# Patient Record
Sex: Female | Born: 1940 | Race: Black or African American | Hispanic: No | Smoking: Never smoker
Health system: Southern US, Community
[De-identification: ages and names within clinical notes are randomized; demographics above are authoritative.]

## PROBLEM LIST (undated history)

## (undated) DIAGNOSIS — E785 Hyperlipidemia, unspecified: Secondary | ICD-10-CM

## (undated) DIAGNOSIS — I10 Essential (primary) hypertension: Secondary | ICD-10-CM

## (undated) DIAGNOSIS — I739 Peripheral vascular disease, unspecified: Secondary | ICD-10-CM

## (undated) DIAGNOSIS — N189 Chronic kidney disease, unspecified: Secondary | ICD-10-CM

## (undated) DIAGNOSIS — I2510 Atherosclerotic heart disease of native coronary artery without angina pectoris: Secondary | ICD-10-CM

## (undated) DIAGNOSIS — M1990 Unspecified osteoarthritis, unspecified site: Secondary | ICD-10-CM

## (undated) DIAGNOSIS — I4891 Unspecified atrial fibrillation: Secondary | ICD-10-CM

## (undated) DIAGNOSIS — I219 Acute myocardial infarction, unspecified: Secondary | ICD-10-CM

## (undated) DIAGNOSIS — A0472 Enterocolitis due to Clostridium difficile, not specified as recurrent: Secondary | ICD-10-CM

## (undated) DIAGNOSIS — I82409 Acute embolism and thrombosis of unspecified deep veins of unspecified lower extremity: Secondary | ICD-10-CM

## (undated) DIAGNOSIS — I509 Heart failure, unspecified: Secondary | ICD-10-CM

## (undated) HISTORY — DX: Heart failure, unspecified: I50.9

## (undated) HISTORY — DX: Hyperlipidemia, unspecified: E78.5

## (undated) HISTORY — DX: Atherosclerotic heart disease of native coronary artery without angina pectoris: I25.10

## (undated) HISTORY — DX: Unspecified atrial fibrillation: I48.91

## (undated) HISTORY — DX: Peripheral vascular disease, unspecified: I73.9

## (undated) HISTORY — DX: Acute myocardial infarction, unspecified: I21.9

## (undated) HISTORY — DX: Unspecified osteoarthritis, unspecified site: M19.90

## (undated) HISTORY — DX: Acute embolism and thrombosis of unspecified deep veins of unspecified lower extremity: I82.409

## (undated) HISTORY — DX: Essential (primary) hypertension: I10

## (undated) HISTORY — PX: CHOLECYSTECTOMY: SHX55

## (undated) HISTORY — DX: Chronic kidney disease, unspecified: N18.9

## (undated) HISTORY — PX: ABDOMINAL HYSTERECTOMY: SHX81

## (undated) HISTORY — DX: Enterocolitis due to Clostridium difficile, not specified as recurrent: A04.72

---

## 2007-04-04 HISTORY — PX: CORONARY ARTERY BYPASS GRAFT: SHX141

## 2009-06-02 ENCOUNTER — Ambulatory Visit: Payer: Self-pay | Admitting: Vascular Surgery

## 2009-06-04 ENCOUNTER — Ambulatory Visit: Payer: Self-pay | Admitting: Vascular Surgery

## 2009-06-04 ENCOUNTER — Ambulatory Visit (HOSPITAL_COMMUNITY): Admission: RE | Admit: 2009-06-04 | Discharge: 2009-06-04 | Payer: Self-pay | Admitting: Vascular Surgery

## 2009-06-07 ENCOUNTER — Inpatient Hospital Stay (HOSPITAL_COMMUNITY): Admission: RE | Admit: 2009-06-07 | Discharge: 2009-06-20 | Payer: Self-pay | Admitting: Vascular Surgery

## 2009-06-08 ENCOUNTER — Encounter: Payer: Self-pay | Admitting: Vascular Surgery

## 2009-07-15 ENCOUNTER — Ambulatory Visit: Payer: Self-pay | Admitting: Surgery

## 2009-07-15 ENCOUNTER — Encounter (INDEPENDENT_AMBULATORY_CARE_PROVIDER_SITE_OTHER): Payer: Self-pay | Admitting: Internal Medicine

## 2009-07-15 ENCOUNTER — Inpatient Hospital Stay (HOSPITAL_COMMUNITY): Admission: EM | Admit: 2009-07-15 | Discharge: 2009-07-20 | Payer: Self-pay | Admitting: Emergency Medicine

## 2009-08-04 ENCOUNTER — Ambulatory Visit: Payer: Self-pay | Admitting: Vascular Surgery

## 2009-08-12 ENCOUNTER — Inpatient Hospital Stay (HOSPITAL_COMMUNITY): Admission: RE | Admit: 2009-08-12 | Discharge: 2009-08-17 | Payer: Self-pay | Admitting: Vascular Surgery

## 2009-08-13 ENCOUNTER — Encounter: Payer: Self-pay | Admitting: Vascular Surgery

## 2009-08-13 ENCOUNTER — Ambulatory Visit: Payer: Self-pay | Admitting: Vascular Surgery

## 2009-08-13 HISTORY — PX: ABOVE KNEE LEG AMPUTATION: SUR20

## 2009-08-16 ENCOUNTER — Ambulatory Visit: Payer: Self-pay | Admitting: Physical Medicine & Rehabilitation

## 2009-08-17 ENCOUNTER — Inpatient Hospital Stay (HOSPITAL_COMMUNITY)
Admission: RE | Admit: 2009-08-17 | Discharge: 2009-08-27 | Payer: Self-pay | Admitting: Physical Medicine & Rehabilitation

## 2009-09-08 ENCOUNTER — Ambulatory Visit: Payer: Self-pay | Admitting: Vascular Surgery

## 2009-11-10 ENCOUNTER — Ambulatory Visit: Payer: Self-pay | Admitting: Vascular Surgery

## 2009-11-26 ENCOUNTER — Encounter
Admission: RE | Admit: 2009-11-26 | Discharge: 2010-02-24 | Payer: Self-pay | Source: Home / Self Care | Admitting: Physical Medicine & Rehabilitation

## 2009-12-03 ENCOUNTER — Ambulatory Visit: Payer: Self-pay | Admitting: Physical Medicine & Rehabilitation

## 2010-02-01 ENCOUNTER — Encounter: Admission: RE | Admit: 2010-02-01 | Discharge: 2010-03-01 | Payer: Self-pay | Source: Home / Self Care

## 2010-02-17 ENCOUNTER — Ambulatory Visit: Payer: Self-pay | Admitting: Vascular Surgery

## 2010-02-25 ENCOUNTER — Ambulatory Visit (HOSPITAL_COMMUNITY): Admission: RE | Admit: 2010-02-25 | Discharge: 2010-02-25 | Payer: Self-pay | Admitting: Vascular Surgery

## 2010-02-25 ENCOUNTER — Ambulatory Visit: Payer: Self-pay | Admitting: Vascular Surgery

## 2010-03-08 ENCOUNTER — Inpatient Hospital Stay (HOSPITAL_COMMUNITY)
Admission: RE | Admit: 2010-03-08 | Discharge: 2010-03-14 | Payer: Self-pay | Source: Home / Self Care | Attending: Vascular Surgery | Admitting: Vascular Surgery

## 2010-03-08 HISTORY — PX: FEMORAL BYPASS: SHX50

## 2010-03-10 ENCOUNTER — Encounter: Payer: Self-pay | Admitting: Vascular Surgery

## 2010-03-24 ENCOUNTER — Ambulatory Visit: Payer: Self-pay | Admitting: Vascular Surgery

## 2010-04-07 ENCOUNTER — Ambulatory Visit
Admission: RE | Admit: 2010-04-07 | Discharge: 2010-04-07 | Payer: Self-pay | Source: Home / Self Care | Attending: Vascular Surgery | Admitting: Vascular Surgery

## 2010-04-28 ENCOUNTER — Ambulatory Visit: Admit: 2010-04-28 | Payer: Self-pay | Admitting: Vascular Surgery

## 2010-05-12 ENCOUNTER — Ambulatory Visit (INDEPENDENT_AMBULATORY_CARE_PROVIDER_SITE_OTHER): Payer: Medicare Other | Admitting: Vascular Surgery

## 2010-05-12 DIAGNOSIS — I70219 Atherosclerosis of native arteries of extremities with intermittent claudication, unspecified extremity: Secondary | ICD-10-CM

## 2010-05-13 NOTE — Assessment & Plan Note (Addendum)
OFFICE VISIT  SANDREA, BOER DOB:  1940/09/30                                       05/12/2010 ZOXWR#:[id number]  The patient returns for followup today.  She previously underwent a left femoral to below knee popliteal bypass with Propaten on December 6.  She had some trouble healing up her groin incision and presents today for continued observation of this.  She denies any further drainage from the incision.  She states that she has no pain in her left foot.  She has some intermittent phantom pains where her right foot used to be.  She is only minimally using her right above knee amputation prosthetic.  I discussed with her today continuing to use this over time to try to improve her ambulation.  PHYSICAL EXAM:  Today blood pressure is 146/71 in the left arm, heart rate 69 and regular and temperature is 98.1.  Left groin wound is completely healed at this point.  She has an easily palpable 2+ dorsalis pedis pulse in the left foot.  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  Overall, the patient is doing well at this point.  I encouraged her again to try to use her prosthetic leg for the right leg.  She will follow up with a protocol scan in March of 2012.    [name]. Bretta Fees, MD Electronically Signed  CEF/MEDQ  D:  05/12/2010  T:  05/13/2010  Job:  [id number]  cc:   Charlynne Pander

## 2010-06-14 LAB — TYPE AND SCREEN
Antibody Screen: NEGATIVE
Unit division: 0
Unit division: 0
Unit division: 0
Unit division: 0

## 2010-06-14 LAB — CBC
HCT: 33.2 % — ABNORMAL LOW (ref 36.0–46.0)
HCT: 33.8 % — ABNORMAL LOW (ref 36.0–46.0)
HCT: 34.6 % — ABNORMAL LOW (ref 36.0–46.0)
Hemoglobin: 11.4 g/dL — ABNORMAL LOW (ref 12.0–15.0)
Hemoglobin: 9.3 g/dL — ABNORMAL LOW (ref 12.0–15.0)
MCH: 28.1 pg (ref 26.0–34.0)
MCH: 28.3 pg (ref 26.0–34.0)
MCH: 28.4 pg (ref 26.0–34.0)
MCH: 28.7 pg (ref 26.0–34.0)
MCHC: 31.7 g/dL (ref 30.0–36.0)
MCHC: 32.7 g/dL (ref 30.0–36.0)
MCHC: 32.8 g/dL (ref 30.0–36.0)
MCHC: 33.1 g/dL (ref 30.0–36.0)
MCV: 85.2 fL (ref 78.0–100.0)
MCV: 85.8 fL (ref 78.0–100.0)
MCV: 88.5 fL (ref 78.0–100.0)
Platelets: 149 10*3/uL — ABNORMAL LOW (ref 150–400)
Platelets: 172 10*3/uL (ref 150–400)
Platelets: 200 10*3/uL (ref 150–400)
Platelets: 214 10*3/uL (ref 150–400)
Platelets: 321 10*3/uL (ref 150–400)
RBC: 3.07 MIL/uL — ABNORMAL LOW (ref 3.87–5.11)
RBC: 3.82 MIL/uL — ABNORMAL LOW (ref 3.87–5.11)
RBC: 3.87 MIL/uL (ref 3.87–5.11)
RBC: 4.06 MIL/uL (ref 3.87–5.11)
RDW: 13.5 % (ref 11.5–15.5)
RDW: 13.7 % (ref 11.5–15.5)
RDW: 14.3 % (ref 11.5–15.5)
WBC: 11.4 10*3/uL — ABNORMAL HIGH (ref 4.0–10.5)
WBC: 9.4 10*3/uL (ref 4.0–10.5)

## 2010-06-14 LAB — BASIC METABOLIC PANEL
BUN: 22 mg/dL (ref 6–23)
BUN: 22 mg/dL (ref 6–23)
BUN: 27 mg/dL — ABNORMAL HIGH (ref 6–23)
BUN: 34 mg/dL — ABNORMAL HIGH (ref 6–23)
CO2: 21 mEq/L (ref 19–32)
CO2: 22 mEq/L (ref 19–32)
CO2: 23 mEq/L (ref 19–32)
Calcium: 7.8 mg/dL — ABNORMAL LOW (ref 8.4–10.5)
Calcium: 8.2 mg/dL — ABNORMAL LOW (ref 8.4–10.5)
Calcium: 8.2 mg/dL — ABNORMAL LOW (ref 8.4–10.5)
Chloride: 110 mEq/L (ref 96–112)
Chloride: 111 mEq/L (ref 96–112)
Chloride: 115 mEq/L — ABNORMAL HIGH (ref 96–112)
Chloride: 117 mEq/L — ABNORMAL HIGH (ref 96–112)
Creatinine, Ser: 1.86 mg/dL — ABNORMAL HIGH (ref 0.4–1.2)
Creatinine, Ser: 1.91 mg/dL — ABNORMAL HIGH (ref 0.4–1.2)
Creatinine, Ser: 1.94 mg/dL — ABNORMAL HIGH (ref 0.4–1.2)
Creatinine, Ser: 1.95 mg/dL — ABNORMAL HIGH (ref 0.4–1.2)
Creatinine, Ser: 2.38 mg/dL — ABNORMAL HIGH (ref 0.4–1.2)
Creatinine, Ser: 2.5 mg/dL — ABNORMAL HIGH (ref 0.4–1.2)
GFR calc Af Amer: 23 mL/min — ABNORMAL LOW (ref >60)
GFR calc Af Amer: 32 mL/min — ABNORMAL LOW (ref >60)
GFR calc Af Amer: 33 mL/min — ABNORMAL LOW (ref >60)
GFR calc non Af Amer: 26 mL/min — ABNORMAL LOW (ref >60)
Glucose, Bld: 103 mg/dL — ABNORMAL HIGH (ref 70–99)
Glucose, Bld: 129 mg/dL — ABNORMAL HIGH (ref 70–99)
Potassium: 3.3 mEq/L — ABNORMAL LOW (ref 3.5–5.1)
Potassium: 3.5 mEq/L (ref 3.5–5.1)
Potassium: 5.6 mEq/L — ABNORMAL HIGH (ref 3.5–5.1)

## 2010-06-14 LAB — POCT I-STAT, CHEM 8
Calcium, Ion: 1.37 mmol/L — ABNORMAL HIGH (ref 1.12–1.32)
Chloride: 111 mEq/L (ref 96–112)
Glucose, Bld: 222 mg/dL — ABNORMAL HIGH (ref 70–99)
HCT: 36 % (ref 36.0–46.0)
TCO2: 25 mmol/L (ref 0–100)

## 2010-06-14 LAB — URINALYSIS, ROUTINE W REFLEX MICROSCOPIC
Bilirubin Urine: NEGATIVE
Ketones, ur: NEGATIVE mg/dL
Leukocytes, UA: NEGATIVE
Nitrite: NEGATIVE
Protein, ur: 100 mg/dL — AB
pH: 6 (ref 5.0–8.0)

## 2010-06-14 LAB — COMPREHENSIVE METABOLIC PANEL
ALT: 23 U/L (ref 0–35)
Albumin: 3.4 g/dL — ABNORMAL LOW (ref 3.5–5.2)
Alkaline Phosphatase: 121 U/L — ABNORMAL HIGH (ref 39–117)
Chloride: 108 mEq/L (ref 96–112)
Potassium: 5 mEq/L (ref 3.5–5.1)
Sodium: 142 mEq/L (ref 135–145)
Total Bilirubin: 0.3 mg/dL (ref 0.3–1.2)
Total Protein: 6.6 g/dL (ref 6.0–8.3)

## 2010-06-14 LAB — GLUCOSE, CAPILLARY
Glucose-Capillary: 119 mg/dL — ABNORMAL HIGH (ref 70–99)
Glucose-Capillary: 144 mg/dL — ABNORMAL HIGH (ref 70–99)
Glucose-Capillary: 145 mg/dL — ABNORMAL HIGH (ref 70–99)
Glucose-Capillary: 147 mg/dL — ABNORMAL HIGH (ref 70–99)
Glucose-Capillary: 152 mg/dL — ABNORMAL HIGH (ref 70–99)
Glucose-Capillary: 158 mg/dL — ABNORMAL HIGH (ref 70–99)
Glucose-Capillary: 161 mg/dL — ABNORMAL HIGH (ref 70–99)
Glucose-Capillary: 172 mg/dL — ABNORMAL HIGH (ref 70–99)
Glucose-Capillary: 194 mg/dL — ABNORMAL HIGH (ref 70–99)
Glucose-Capillary: 66 mg/dL — ABNORMAL LOW (ref 70–99)
Glucose-Capillary: 67 mg/dL — ABNORMAL LOW (ref 70–99)
Glucose-Capillary: 81 mg/dL (ref 70–99)
Glucose-Capillary: 82 mg/dL (ref 70–99)
Glucose-Capillary: 190 mg/dL — ABNORMAL HIGH (ref 70–99)

## 2010-06-14 LAB — PROTIME-INR
INR: 1.16 (ref 0.00–1.49)
INR: 1.16 (ref 0.00–1.49)
Prothrombin Time: 15 seconds (ref 11.6–15.2)
Prothrombin Time: 15 seconds (ref 11.6–15.2)

## 2010-06-14 LAB — POCT I-STAT 7, (LYTES, BLD GAS, ICA,H+H)
Acid-base deficit: 2 mmol/L (ref 0.0–2.0)
Bicarbonate: 22.4 mEq/L (ref 20.0–24.0)
HCT: 26 % — ABNORMAL LOW (ref 36.0–46.0)
Hemoglobin: 8.8 g/dL — ABNORMAL LOW (ref 12.0–15.0)
TCO2: 23 mmol/L (ref 0–100)
pO2, Arterial: 299 mmHg — ABNORMAL HIGH (ref 80.0–100.0)

## 2010-06-14 LAB — SURGICAL PCR SCREEN: MRSA, PCR: NEGATIVE

## 2010-06-14 LAB — PREPARE RBC (CROSSMATCH)

## 2010-06-14 LAB — POCT I-STAT GLUCOSE: Glucose, Bld: 118 mg/dL — ABNORMAL HIGH (ref 70–99)

## 2010-06-14 LAB — URINE MICROSCOPIC-ADD ON

## 2010-06-20 LAB — CBC
HCT: 22.5 % — ABNORMAL LOW (ref 36.0–46.0)
Hemoglobin: 7.4 g/dL — ABNORMAL LOW (ref 12.0–15.0)
Hemoglobin: 7.5 g/dL — ABNORMAL LOW (ref 12.0–15.0)
MCHC: 32.8 g/dL (ref 30.0–36.0)
MCHC: 33 g/dL (ref 30.0–36.0)
MCHC: 33.6 g/dL (ref 30.0–36.0)
MCV: 89.9 fL (ref 78.0–100.0)
Platelets: 307 10*3/uL (ref 150–400)
RBC: 2.5 MIL/uL — ABNORMAL LOW (ref 3.87–5.11)
RDW: 15.3 % (ref 11.5–15.5)
RDW: 15.5 % (ref 11.5–15.5)

## 2010-06-20 LAB — GLUCOSE, CAPILLARY
Glucose-Capillary: 103 mg/dL — ABNORMAL HIGH (ref 70–99)
Glucose-Capillary: 108 mg/dL — ABNORMAL HIGH (ref 70–99)
Glucose-Capillary: 124 mg/dL — ABNORMAL HIGH (ref 70–99)
Glucose-Capillary: 130 mg/dL — ABNORMAL HIGH (ref 70–99)
Glucose-Capillary: 131 mg/dL — ABNORMAL HIGH (ref 70–99)
Glucose-Capillary: 136 mg/dL — ABNORMAL HIGH (ref 70–99)
Glucose-Capillary: 138 mg/dL — ABNORMAL HIGH (ref 70–99)
Glucose-Capillary: 138 mg/dL — ABNORMAL HIGH (ref 70–99)
Glucose-Capillary: 142 mg/dL — ABNORMAL HIGH (ref 70–99)
Glucose-Capillary: 150 mg/dL — ABNORMAL HIGH (ref 70–99)
Glucose-Capillary: 150 mg/dL — ABNORMAL HIGH (ref 70–99)
Glucose-Capillary: 154 mg/dL — ABNORMAL HIGH (ref 70–99)
Glucose-Capillary: 162 mg/dL — ABNORMAL HIGH (ref 70–99)
Glucose-Capillary: 164 mg/dL — ABNORMAL HIGH (ref 70–99)
Glucose-Capillary: 166 mg/dL — ABNORMAL HIGH (ref 70–99)
Glucose-Capillary: 170 mg/dL — ABNORMAL HIGH (ref 70–99)
Glucose-Capillary: 170 mg/dL — ABNORMAL HIGH (ref 70–99)
Glucose-Capillary: 173 mg/dL — ABNORMAL HIGH (ref 70–99)
Glucose-Capillary: 176 mg/dL — ABNORMAL HIGH (ref 70–99)
Glucose-Capillary: 182 mg/dL — ABNORMAL HIGH (ref 70–99)
Glucose-Capillary: 241 mg/dL — ABNORMAL HIGH (ref 70–99)
Glucose-Capillary: 242 mg/dL — ABNORMAL HIGH (ref 70–99)
Glucose-Capillary: 65 mg/dL — ABNORMAL LOW (ref 70–99)
Glucose-Capillary: 73 mg/dL (ref 70–99)
Glucose-Capillary: 99 mg/dL (ref 70–99)

## 2010-06-20 LAB — DIFFERENTIAL
Lymphs Abs: 2.6 10*3/uL (ref 0.7–4.0)
Monocytes Relative: 4 % (ref 3–12)
Neutro Abs: 11.7 10*3/uL — ABNORMAL HIGH (ref 1.7–7.7)
Neutrophils Relative %: 76 % (ref 43–77)

## 2010-06-20 LAB — COMPREHENSIVE METABOLIC PANEL
ALT: 20 U/L (ref 0–35)
Albumin: 2.8 g/dL — ABNORMAL LOW (ref 3.5–5.2)
BUN: 14 mg/dL (ref 6–23)
Calcium: 10 mg/dL (ref 8.4–10.5)
Glucose, Bld: 215 mg/dL — ABNORMAL HIGH (ref 70–99)
Sodium: 135 mEq/L (ref 135–145)
Total Protein: 6.3 g/dL (ref 6.0–8.3)

## 2010-06-20 LAB — URINALYSIS, ROUTINE W REFLEX MICROSCOPIC
Bilirubin Urine: NEGATIVE
Ketones, ur: NEGATIVE mg/dL
Nitrite: NEGATIVE
Protein, ur: 100 mg/dL — AB
Urobilinogen, UA: 0.2 mg/dL (ref 0.0–1.0)
pH: 5.5 (ref 5.0–8.0)

## 2010-06-20 LAB — RENAL FUNCTION PANEL
Albumin: 2.8 g/dL — ABNORMAL LOW (ref 3.5–5.2)
BUN: 9 mg/dL (ref 6–23)
Chloride: 106 mEq/L (ref 96–112)
Glucose, Bld: 145 mg/dL — ABNORMAL HIGH (ref 70–99)
Phosphorus: 3.1 mg/dL (ref 2.3–4.6)
Potassium: 5 mEq/L (ref 3.5–5.1)
Sodium: 135 mEq/L (ref 135–145)

## 2010-06-20 LAB — HEMOCCULT GUIAC POC 1CARD (OFFICE): Fecal Occult Bld: NEGATIVE

## 2010-06-20 LAB — BASIC METABOLIC PANEL
BUN: 14 mg/dL (ref 6–23)
CO2: 24 mEq/L (ref 19–32)
Calcium: 10.1 mg/dL (ref 8.4–10.5)
Creatinine, Ser: 1.62 mg/dL — ABNORMAL HIGH (ref 0.4–1.2)
Glucose, Bld: 193 mg/dL — ABNORMAL HIGH (ref 70–99)

## 2010-06-20 LAB — FERRITIN: Ferritin: 713 ng/mL — ABNORMAL HIGH (ref 10–291)

## 2010-06-20 LAB — URINE MICROSCOPIC-ADD ON

## 2010-06-20 LAB — URINE CULTURE

## 2010-06-20 LAB — CARDIAC PANEL(CRET KIN+CKTOT+MB+TROPI)
CK, MB: 2.7 ng/mL (ref 0.3–4.0)
Relative Index: 1.7 (ref 0.0–2.5)

## 2010-06-20 LAB — HEMOGLOBIN AND HEMATOCRIT, BLOOD
HCT: 29.9 % — ABNORMAL LOW (ref 36.0–46.0)
Hemoglobin: 9.9 g/dL — ABNORMAL LOW (ref 12.0–15.0)

## 2010-06-21 LAB — GLUCOSE, CAPILLARY
Glucose-Capillary: 114 mg/dL — ABNORMAL HIGH (ref 70–99)
Glucose-Capillary: 118 mg/dL — ABNORMAL HIGH (ref 70–99)
Glucose-Capillary: 148 mg/dL — ABNORMAL HIGH (ref 70–99)
Glucose-Capillary: 149 mg/dL — ABNORMAL HIGH (ref 70–99)
Glucose-Capillary: 164 mg/dL — ABNORMAL HIGH (ref 70–99)
Glucose-Capillary: 168 mg/dL — ABNORMAL HIGH (ref 70–99)
Glucose-Capillary: 188 mg/dL — ABNORMAL HIGH (ref 70–99)
Glucose-Capillary: 199 mg/dL — ABNORMAL HIGH (ref 70–99)
Glucose-Capillary: 89 mg/dL (ref 70–99)
Glucose-Capillary: 123 mg/dL — ABNORMAL HIGH (ref 70–99)
Glucose-Capillary: 150 mg/dL — ABNORMAL HIGH (ref 70–99)
Glucose-Capillary: 150 mg/dL — ABNORMAL HIGH (ref 70–99)
Glucose-Capillary: 153 mg/dL — ABNORMAL HIGH (ref 70–99)
Glucose-Capillary: 166 mg/dL — ABNORMAL HIGH (ref 70–99)
Glucose-Capillary: 195 mg/dL — ABNORMAL HIGH (ref 70–99)
Glucose-Capillary: 208 mg/dL — ABNORMAL HIGH (ref 70–99)
Glucose-Capillary: 98 mg/dL (ref 70–99)

## 2010-06-21 LAB — BASIC METABOLIC PANEL
BUN: 18 mg/dL (ref 6–23)
BUN: 21 mg/dL (ref 6–23)
BUN: 22 mg/dL (ref 6–23)
BUN: 6 mg/dL (ref 6–23)
BUN: 6 mg/dL (ref 6–23)
CO2: 24 mEq/L (ref 19–32)
CO2: 24 mEq/L (ref 19–32)
CO2: 27 mEq/L (ref 19–32)
CO2: 27 mEq/L (ref 19–32)
Calcium: 10.5 mg/dL (ref 8.4–10.5)
Calcium: 11.3 mg/dL — ABNORMAL HIGH (ref 8.4–10.5)
Chloride: 105 mEq/L (ref 96–112)
Chloride: 105 mEq/L (ref 96–112)
Chloride: 109 mEq/L (ref 96–112)
Chloride: 106 mEq/L (ref 96–112)
Chloride: 107 mEq/L (ref 96–112)
Creatinine, Ser: 2.06 mg/dL — ABNORMAL HIGH (ref 0.4–1.2)
Creatinine, Ser: 2.15 mg/dL — ABNORMAL HIGH (ref 0.4–1.2)
Creatinine, Ser: 2.19 mg/dL — ABNORMAL HIGH (ref 0.4–1.2)
Creatinine, Ser: 1.33 mg/dL — ABNORMAL HIGH (ref 0.4–1.2)
GFR calc Af Amer: 27 mL/min — ABNORMAL LOW (ref >60)
GFR calc Af Amer: 28 mL/min — ABNORMAL LOW (ref >60)
GFR calc Af Amer: 48 mL/min — ABNORMAL LOW (ref >60)
GFR calc non Af Amer: 23 mL/min — ABNORMAL LOW (ref >60)
GFR calc non Af Amer: 38 mL/min — ABNORMAL LOW (ref >60)
Glucose, Bld: 175 mg/dL — ABNORMAL HIGH (ref 70–99)
Glucose, Bld: 219 mg/dL — ABNORMAL HIGH (ref 70–99)
Potassium: 5.5 mEq/L — ABNORMAL HIGH (ref 3.5–5.1)
Potassium: 4.1 mEq/L (ref 3.5–5.1)
Potassium: 4.4 mEq/L (ref 3.5–5.1)
Sodium: 140 mEq/L (ref 135–145)

## 2010-06-21 LAB — CARDIAC PANEL(CRET KIN+CKTOT+MB+TROPI)
Relative Index: 1.6 (ref 0.0–2.5)
Relative Index: 2 (ref 0.0–2.5)
Troponin I: 0.06 ng/mL (ref 0.00–0.06)

## 2010-06-21 LAB — RENAL FUNCTION PANEL
BUN: 14 mg/dL (ref 6–23)
CO2: 20 mEq/L (ref 19–32)
Calcium: 9.6 mg/dL (ref 8.4–10.5)
Creatinine, Ser: 1.45 mg/dL — ABNORMAL HIGH (ref 0.4–1.2)
Glucose, Bld: 119 mg/dL — ABNORMAL HIGH (ref 70–99)
Phosphorus: 2.6 mg/dL (ref 2.3–4.6)

## 2010-06-21 LAB — CBC
HCT: 33.4 % — ABNORMAL LOW (ref 36.0–46.0)
HCT: 29.4 % — ABNORMAL LOW (ref 36.0–46.0)
HCT: 31.6 % — ABNORMAL LOW (ref 36.0–46.0)
Hemoglobin: 8.9 g/dL — ABNORMAL LOW (ref 12.0–15.0)
Hemoglobin: 10.1 g/dL — ABNORMAL LOW (ref 12.0–15.0)
MCHC: 32.8 g/dL (ref 30.0–36.0)
MCHC: 33.3 g/dL (ref 30.0–36.0)
MCHC: 33.4 g/dL (ref 30.0–36.0)
MCHC: 33.5 g/dL (ref 30.0–36.0)
MCHC: 33.6 g/dL (ref 30.0–36.0)
MCV: 88.7 fL (ref 78.0–100.0)
MCV: 88.9 fL (ref 78.0–100.0)
MCV: 89 fL (ref 78.0–100.0)
MCV: 89.2 fL (ref 78.0–100.0)
MCV: 89.3 fL (ref 78.0–100.0)
MCV: 89.4 fL (ref 78.0–100.0)
Platelets: 286 10*3/uL (ref 150–400)
Platelets: 311 10*3/uL (ref 150–400)
Platelets: 323 10*3/uL (ref 150–400)
Platelets: 340 10*3/uL (ref 150–400)
Platelets: 341 10*3/uL (ref 150–400)
RBC: 3.04 MIL/uL — ABNORMAL LOW (ref 3.87–5.11)
RBC: 3.93 MIL/uL (ref 3.87–5.11)
RBC: 3.95 MIL/uL (ref 3.87–5.11)
RBC: 3.38 MIL/uL — ABNORMAL LOW (ref 3.87–5.11)
RDW: 14.6 % (ref 11.5–15.5)
RDW: 15.6 % — ABNORMAL HIGH (ref 11.5–15.5)
RDW: 15.8 % — ABNORMAL HIGH (ref 11.5–15.5)
RDW: 16.2 % — ABNORMAL HIGH (ref 11.5–15.5)
WBC: 10.4 10*3/uL (ref 4.0–10.5)
WBC: 11.1 10*3/uL — ABNORMAL HIGH (ref 4.0–10.5)

## 2010-06-21 LAB — HEMOGLOBIN A1C
Hgb A1c MFr Bld: 7.9 % — ABNORMAL HIGH (ref <5.7)
Mean Plasma Glucose: 180 mg/dL — ABNORMAL HIGH (ref <117)

## 2010-06-22 LAB — GLUCOSE, CAPILLARY
Glucose-Capillary: 170 mg/dL — ABNORMAL HIGH (ref 70–99)
Glucose-Capillary: 175 mg/dL — ABNORMAL HIGH (ref 70–99)
Glucose-Capillary: 176 mg/dL — ABNORMAL HIGH (ref 70–99)
Glucose-Capillary: 212 mg/dL — ABNORMAL HIGH (ref 70–99)
Glucose-Capillary: 250 mg/dL — ABNORMAL HIGH (ref 70–99)

## 2010-06-22 LAB — POCT I-STAT, CHEM 8
BUN: 6 mg/dL (ref 6–23)
Chloride: 105 mEq/L (ref 96–112)
Creatinine, Ser: 1.4 mg/dL — ABNORMAL HIGH (ref 0.4–1.2)
Potassium: 3.2 mEq/L — ABNORMAL LOW (ref 3.5–5.1)
Sodium: 140 mEq/L (ref 135–145)
TCO2: 24 mmol/L (ref 0–100)

## 2010-06-22 LAB — DIFFERENTIAL
Basophils Absolute: 0 10*3/uL (ref 0.0–0.1)
Basophils Relative: 0 % (ref 0–1)
Eosinophils Absolute: 0.1 10*3/uL (ref 0.0–0.7)
Lymphocytes Relative: 16 % (ref 12–46)
Lymphs Abs: 2.1 10*3/uL (ref 0.7–4.0)
Lymphs Abs: 2.3 10*3/uL (ref 0.7–4.0)
Monocytes Absolute: 0.8 10*3/uL (ref 0.1–1.0)
Monocytes Relative: 6 % (ref 3–12)
Neutro Abs: 10.9 10*3/uL — ABNORMAL HIGH (ref 1.7–7.7)

## 2010-06-22 LAB — IRON AND TIBC: UIBC: 127 ug/dL

## 2010-06-22 LAB — CBC
HCT: 31.9 % — ABNORMAL LOW (ref 36.0–46.0)
HCT: 32 % — ABNORMAL LOW (ref 36.0–46.0)
Hemoglobin: 10.4 g/dL — ABNORMAL LOW (ref 12.0–15.0)
MCHC: 32.6 g/dL (ref 30.0–36.0)
MCHC: 33.4 g/dL (ref 30.0–36.0)
MCV: 88.8 fL (ref 78.0–100.0)
MCV: 88.9 fL (ref 78.0–100.0)
Platelets: 309 10*3/uL (ref 150–400)
RBC: 3.6 MIL/uL — ABNORMAL LOW (ref 3.87–5.11)
RBC: 3.6 MIL/uL — ABNORMAL LOW (ref 3.87–5.11)
WBC: 13.7 10*3/uL — ABNORMAL HIGH (ref 4.0–10.5)
WBC: 14.3 10*3/uL — ABNORMAL HIGH (ref 4.0–10.5)

## 2010-06-22 LAB — MAGNESIUM: Magnesium: 0.9 mg/dL — CL (ref 1.5–2.5)

## 2010-06-22 LAB — URINALYSIS, ROUTINE W REFLEX MICROSCOPIC
Bilirubin Urine: NEGATIVE
Hgb urine dipstick: NEGATIVE
Specific Gravity, Urine: 1.007 (ref 1.005–1.030)
pH: 5 (ref 5.0–8.0)

## 2010-06-22 LAB — HEMOGLOBIN A1C
Hgb A1c MFr Bld: 7.7 % — ABNORMAL HIGH (ref <5.7)
Mean Plasma Glucose: 174 mg/dL — ABNORMAL HIGH (ref <117)

## 2010-06-22 LAB — WOUND CULTURE

## 2010-06-22 LAB — URINE MICROSCOPIC-ADD ON

## 2010-06-22 LAB — FERRITIN: Ferritin: 420 ng/mL — ABNORMAL HIGH (ref 10–291)

## 2010-06-22 LAB — COMPREHENSIVE METABOLIC PANEL
AST: 13 U/L (ref 0–37)
BUN: 5 mg/dL — ABNORMAL LOW (ref 6–23)
CO2: 25 mEq/L (ref 19–32)
Calcium: 8.6 mg/dL (ref 8.4–10.5)
Chloride: 101 mEq/L (ref 96–112)
Creatinine, Ser: 1.35 mg/dL — ABNORMAL HIGH (ref 0.4–1.2)
GFR calc Af Amer: 47 mL/min — ABNORMAL LOW (ref >60)
GFR calc non Af Amer: 39 mL/min — ABNORMAL LOW (ref >60)
Total Bilirubin: 0.9 mg/dL (ref 0.3–1.2)

## 2010-06-22 LAB — URINE CULTURE

## 2010-06-22 LAB — FOLATE: Folate: >20 ng/mL

## 2010-06-22 LAB — RETICULOCYTES
RBC.: 3.7 MIL/uL — ABNORMAL LOW (ref 3.87–5.11)
Retic Ct Pct: 1.3 % (ref 0.4–3.1)

## 2010-06-22 LAB — PHOSPHORUS: Phosphorus: 2.4 mg/dL (ref 2.3–4.6)

## 2010-06-22 LAB — PROTIME-INR: Prothrombin Time: 15.5 seconds — ABNORMAL HIGH (ref 11.6–15.2)

## 2010-06-27 LAB — BASIC METABOLIC PANEL
BUN: 49 mg/dL — ABNORMAL HIGH (ref 6–23)
BUN: 50 mg/dL — ABNORMAL HIGH (ref 6–23)
BUN: 51 mg/dL — ABNORMAL HIGH (ref 6–23)
BUN: 54 mg/dL — ABNORMAL HIGH (ref 6–23)
CO2: 19 mEq/L (ref 19–32)
CO2: 20 mEq/L (ref 19–32)
CO2: 21 mEq/L (ref 19–32)
CO2: 29 mEq/L (ref 19–32)
Calcium: 7 mg/dL — ABNORMAL LOW (ref 8.4–10.5)
Calcium: 7.6 mg/dL — ABNORMAL LOW (ref 8.4–10.5)
Calcium: 9.2 mg/dL (ref 8.4–10.5)
Chloride: 103 mEq/L (ref 96–112)
Chloride: 110 mEq/L (ref 96–112)
Creatinine, Ser: 3.23 mg/dL — ABNORMAL HIGH (ref 0.4–1.2)
Creatinine, Ser: 3.25 mg/dL — ABNORMAL HIGH (ref 0.4–1.2)
Creatinine, Ser: 3.31 mg/dL — ABNORMAL HIGH (ref 0.4–1.2)
Creatinine, Ser: 3.68 mg/dL — ABNORMAL HIGH (ref 0.4–1.2)
GFR calc Af Amer: 12 mL/min — ABNORMAL LOW (ref >60)
GFR calc Af Amer: 14 mL/min — ABNORMAL LOW (ref >60)
GFR calc Af Amer: 15 mL/min — ABNORMAL LOW (ref >60)
GFR calc Af Amer: 17 mL/min — ABNORMAL LOW (ref >60)
GFR calc Af Amer: 17 mL/min — ABNORMAL LOW (ref >60)
GFR calc Af Amer: 18 mL/min — ABNORMAL LOW (ref >60)
GFR calc non Af Amer: 10 mL/min — ABNORMAL LOW (ref >60)
GFR calc non Af Amer: 14 mL/min — ABNORMAL LOW (ref >60)
GFR calc non Af Amer: 14 mL/min — ABNORMAL LOW (ref >60)
GFR calc non Af Amer: 15 mL/min — ABNORMAL LOW (ref >60)
Glucose, Bld: 61 mg/dL — ABNORMAL LOW (ref 70–99)
Glucose, Bld: 94 mg/dL (ref 70–99)
Potassium: 3.6 mEq/L (ref 3.5–5.1)
Potassium: 4.1 mEq/L (ref 3.5–5.1)
Potassium: 4.1 mEq/L (ref 3.5–5.1)
Sodium: 134 mEq/L — ABNORMAL LOW (ref 135–145)
Sodium: 134 mEq/L — ABNORMAL LOW (ref 135–145)
Sodium: 135 mEq/L (ref 135–145)
Sodium: 135 mEq/L (ref 135–145)
Sodium: 137 mEq/L (ref 135–145)

## 2010-06-27 LAB — CBC
HCT: 22.3 % — ABNORMAL LOW (ref 36.0–46.0)
HCT: 26.3 % — ABNORMAL LOW (ref 36.0–46.0)
HCT: 26.6 % — ABNORMAL LOW (ref 36.0–46.0)
HCT: 27.5 % — ABNORMAL LOW (ref 36.0–46.0)
HCT: 30.1 % — ABNORMAL LOW (ref 36.0–46.0)
HCT: 31 % — ABNORMAL LOW (ref 36.0–46.0)
Hemoglobin: 10.4 g/dL — ABNORMAL LOW (ref 12.0–15.0)
Hemoglobin: 11.2 g/dL — ABNORMAL LOW (ref 12.0–15.0)
Hemoglobin: 7.4 g/dL — ABNORMAL LOW (ref 12.0–15.0)
Hemoglobin: 8.9 g/dL — ABNORMAL LOW (ref 12.0–15.0)
Hemoglobin: 9.2 g/dL — ABNORMAL LOW (ref 12.0–15.0)
MCHC: 32.8 g/dL (ref 30.0–36.0)
MCHC: 33.2 g/dL (ref 30.0–36.0)
MCHC: 33.2 g/dL (ref 30.0–36.0)
MCHC: 33.6 g/dL (ref 30.0–36.0)
MCV: 86.2 fL (ref 78.0–100.0)
MCV: 86.6 fL (ref 78.0–100.0)
MCV: 86.6 fL (ref 78.0–100.0)
MCV: 86.9 fL (ref 78.0–100.0)
MCV: 87.3 fL (ref 78.0–100.0)
Platelets: 210 10*3/uL (ref 150–400)
Platelets: 217 10*3/uL (ref 150–400)
Platelets: 262 10*3/uL (ref 150–400)
Platelets: 302 10*3/uL (ref 150–400)
Platelets: 410 10*3/uL — ABNORMAL HIGH (ref 150–400)
Platelets: 415 10*3/uL — ABNORMAL HIGH (ref 150–400)
Platelets: 428 10*3/uL — ABNORMAL HIGH (ref 150–400)
RBC: 2.56 MIL/uL — ABNORMAL LOW (ref 3.87–5.11)
RBC: 3.07 MIL/uL — ABNORMAL LOW (ref 3.87–5.11)
RBC: 3.17 MIL/uL — ABNORMAL LOW (ref 3.87–5.11)
RBC: 3.46 MIL/uL — ABNORMAL LOW (ref 3.87–5.11)
RDW: 12.9 % (ref 11.5–15.5)
RDW: 13.6 % (ref 11.5–15.5)
RDW: 13.6 % (ref 11.5–15.5)
RDW: 14.1 % (ref 11.5–15.5)
WBC: 13.1 10*3/uL — ABNORMAL HIGH (ref 4.0–10.5)
WBC: 14.1 10*3/uL — ABNORMAL HIGH (ref 4.0–10.5)
WBC: 15.1 10*3/uL — ABNORMAL HIGH (ref 4.0–10.5)
WBC: 15.2 10*3/uL — ABNORMAL HIGH (ref 4.0–10.5)
WBC: 19.5 10*3/uL — ABNORMAL HIGH (ref 4.0–10.5)

## 2010-06-27 LAB — GLUCOSE, CAPILLARY
Glucose-Capillary: 100 mg/dL — ABNORMAL HIGH (ref 70–99)
Glucose-Capillary: 103 mg/dL — ABNORMAL HIGH (ref 70–99)
Glucose-Capillary: 112 mg/dL — ABNORMAL HIGH (ref 70–99)
Glucose-Capillary: 112 mg/dL — ABNORMAL HIGH (ref 70–99)
Glucose-Capillary: 115 mg/dL — ABNORMAL HIGH (ref 70–99)
Glucose-Capillary: 120 mg/dL — ABNORMAL HIGH (ref 70–99)
Glucose-Capillary: 121 mg/dL — ABNORMAL HIGH (ref 70–99)
Glucose-Capillary: 122 mg/dL — ABNORMAL HIGH (ref 70–99)
Glucose-Capillary: 126 mg/dL — ABNORMAL HIGH (ref 70–99)
Glucose-Capillary: 131 mg/dL — ABNORMAL HIGH (ref 70–99)
Glucose-Capillary: 132 mg/dL — ABNORMAL HIGH (ref 70–99)
Glucose-Capillary: 137 mg/dL — ABNORMAL HIGH (ref 70–99)
Glucose-Capillary: 138 mg/dL — ABNORMAL HIGH (ref 70–99)
Glucose-Capillary: 140 mg/dL — ABNORMAL HIGH (ref 70–99)
Glucose-Capillary: 147 mg/dL — ABNORMAL HIGH (ref 70–99)
Glucose-Capillary: 155 mg/dL — ABNORMAL HIGH (ref 70–99)
Glucose-Capillary: 164 mg/dL — ABNORMAL HIGH (ref 70–99)
Glucose-Capillary: 167 mg/dL — ABNORMAL HIGH (ref 70–99)
Glucose-Capillary: 182 mg/dL — ABNORMAL HIGH (ref 70–99)
Glucose-Capillary: 214 mg/dL — ABNORMAL HIGH (ref 70–99)
Glucose-Capillary: 216 mg/dL — ABNORMAL HIGH (ref 70–99)
Glucose-Capillary: 224 mg/dL — ABNORMAL HIGH (ref 70–99)
Glucose-Capillary: 268 mg/dL — ABNORMAL HIGH (ref 70–99)
Glucose-Capillary: 61 mg/dL — ABNORMAL LOW (ref 70–99)
Glucose-Capillary: 71 mg/dL (ref 70–99)
Glucose-Capillary: 71 mg/dL (ref 70–99)
Glucose-Capillary: 75 mg/dL (ref 70–99)
Glucose-Capillary: 76 mg/dL (ref 70–99)
Glucose-Capillary: 90 mg/dL (ref 70–99)
Glucose-Capillary: 91 mg/dL (ref 70–99)
Glucose-Capillary: 92 mg/dL (ref 70–99)

## 2010-06-27 LAB — URINALYSIS, ROUTINE W REFLEX MICROSCOPIC
Bilirubin Urine: NEGATIVE
Glucose, UA: 250 mg/dL — AB
Hgb urine dipstick: NEGATIVE
Ketones, ur: NEGATIVE mg/dL
Nitrite: NEGATIVE
Protein, ur: 30 mg/dL — AB
Protein, ur: NEGATIVE mg/dL
Specific Gravity, Urine: 1.008 (ref 1.005–1.030)
Urobilinogen, UA: 1 mg/dL (ref 0.0–1.0)

## 2010-06-27 LAB — TYPE AND SCREEN
ABO/RH(D): B POS
Antibody Screen: NEGATIVE

## 2010-06-27 LAB — PROTIME-INR
INR: 1.09 (ref 0.00–1.49)
Prothrombin Time: 14 seconds (ref 11.6–15.2)

## 2010-06-27 LAB — URINALYSIS, MICROSCOPIC ONLY
Ketones, ur: NEGATIVE mg/dL
Nitrite: NEGATIVE
Protein, ur: 30 mg/dL — AB
Urobilinogen, UA: 1 mg/dL (ref 0.0–1.0)

## 2010-06-27 LAB — RENAL FUNCTION PANEL
Albumin: 2 g/dL — ABNORMAL LOW (ref 3.5–5.2)
BUN: 44 mg/dL — ABNORMAL HIGH (ref 6–23)
CO2: 28 mEq/L (ref 19–32)
Calcium: 8.2 mg/dL — ABNORMAL LOW (ref 8.4–10.5)
Chloride: 97 mEq/L (ref 96–112)
GFR calc Af Amer: 14 mL/min — ABNORMAL LOW (ref >60)
Glucose, Bld: 119 mg/dL — ABNORMAL HIGH (ref 70–99)
Phosphorus: 3.3 mg/dL (ref 2.3–4.6)
Phosphorus: 3.8 mg/dL (ref 2.3–4.6)
Potassium: 3.6 mEq/L (ref 3.5–5.1)
Potassium: 3.9 mEq/L (ref 3.5–5.1)
Sodium: 136 mEq/L (ref 135–145)
Sodium: 138 mEq/L (ref 135–145)

## 2010-06-27 LAB — COMPREHENSIVE METABOLIC PANEL
ALT: 15 U/L (ref 0–35)
BUN: 23 mg/dL (ref 6–23)
Calcium: 8.9 mg/dL (ref 8.4–10.5)
Creatinine, Ser: 1.57 mg/dL — ABNORMAL HIGH (ref 0.4–1.2)
Glucose, Bld: 301 mg/dL — ABNORMAL HIGH (ref 70–99)
Sodium: 137 mEq/L (ref 135–145)
Total Protein: 6.5 g/dL (ref 6.0–8.3)

## 2010-06-27 LAB — WOUND CULTURE

## 2010-06-27 LAB — POCT I-STAT, CHEM 8
Calcium, Ion: 1.01 mmol/L — ABNORMAL LOW (ref 1.12–1.32)
HCT: 37 % (ref 36.0–46.0)
TCO2: 21 mmol/L (ref 0–100)

## 2010-06-27 LAB — SODIUM, URINE, RANDOM: Sodium, Ur: 40 mEq/L

## 2010-06-27 LAB — ANAEROBIC CULTURE

## 2010-06-27 LAB — CROSSMATCH

## 2010-06-27 LAB — MAGNESIUM: Magnesium: 1.3 mg/dL — ABNORMAL LOW (ref 1.5–2.5)

## 2010-06-27 LAB — BLOOD GAS, ARTERIAL
Acid-base deficit: 1.4 mmol/L (ref 0.0–2.0)
Drawn by: 206361
O2 Saturation: 96.9 %
pO2, Arterial: 84.9 mmHg (ref 80.0–100.0)

## 2010-06-27 LAB — APTT: aPTT: 33 seconds (ref 24–37)

## 2010-06-27 LAB — URINE MICROSCOPIC-ADD ON

## 2010-08-11 ENCOUNTER — Ambulatory Visit: Payer: Medicare Other | Admitting: Vascular Surgery

## 2010-08-16 NOTE — Assessment & Plan Note (Signed)
OFFICE VISIT   Leah Hill, Leah Hill  DOB:  October 01, 1940                                       04/07/2010  EAVWU#:[id number]   The patient returns for followup today.  She previously underwent a left  femoral to below knee popliteal bypass with Propaten on December 6th.  She was last seen in the office on December 22nd.  She was having some  drainage from her below-knee incision and some separation of her groin  incision but overall is doing well.  She currently is residing in a  skilled nursing facility but is scheduled to be discharged on January  18th.  The drainage from her below-knee incision has now completely  stopped.  She denies any fever or chills.  She states that she thinks  she has a couple of days left of her doxycycline.   They are doing local wound care to the groin in the form of washing and  cleaning and placing a dressing over this.  She also has a dry dressing  on the below knee incision.   PHYSICAL EXAMINATION:  Blood pressure is 163/79 in the left arm, heart  rate is 77 and regular, respirations 16.  There is a 1-cm opening at the  inferior aspect of the left groin incision.  The wound is clean.  There  is no significant drainage.  There is granulation at the base.  The  below knee incision is now completely healed.   Overall the patient seems to be doing well.  Her left foot was warm and  well-perfused today.  I am sure that her bypass is patent.  Hopefully  the wounds will continue to heal up.  She will follow up in 3 weeks'  time.  Will continue the same local wound care for now.  She will be  scheduled for a protocol graft scan in March 2012.     [name]. Fields, MD  Electronically Signed   CEF/MEDQ  D:  04/07/2010  T:  04/08/2010  Job:  [id number]   cc:   Charlynne Pander

## 2010-08-16 NOTE — Assessment & Plan Note (Signed)
OFFICE VISIT   ROMA, BONDAR  DOB:  20-Dec-1940                                       09/08/2009  YQMVH#:[id number]   The patient returns for followup today.  She underwent a right above  knee amputation on 08/13/2009.  At that time she had a patent bypass  graft but her foot had continued to degenerate and she was in very poor  nutritional state.  She has subsequently been discharged from rehab and  returns for followup today.  She states that her appetite has returned  to normal and she is gaining weight.  She states that she feels better  now than she has in several months.   On physical exam today her above knee amputation is well-healed.  Staples were removed today.  Of note, her ABI on the left side is 0.4 so  certainly the left leg is at risk long-term.  However, currently is  asymptomatic.  I believe the best option for now is continued  surveillance.  Her daughter will continue to watch her foot and left leg  carefully.  If she develops any signs or symptoms of progressive  worsening ischemia she will return to see me.  Otherwise she will return  for followup ABIs in August of 2011.     [name]. Fields, MD  Electronically Signed   CEF/MEDQ  D:  09/08/2009  T:  09/09/2009  Job:  [id number]   cc:   Dr Toni Amend  Dr Alona Bene

## 2010-08-16 NOTE — Assessment & Plan Note (Signed)
OFFICE VISIT   Leah Hill, Leah Hill  DOB:  [birth year]                                       11/10/2009  ZOXWR#:[id number]   The patient returns for followup today.  She previously underwent a  right above knee amputation in May of 2011.  She is scheduled to get a  prosthesis for this hopefully today.  She overall is improving.  Her  nutrition status has improved significantly.  She feels well at this  point.  She denies any rest pain in her left foot.  She does not really  ambulate much so she does relate claudication on the left side.  She has  had no problems with nonhealing wounds or ulcerations in the left foot.  Baseline ABI on the left side is just under 0.5.   REVIEW OF SYSTEMS:  Today she denies any shortness of breath or chest  pain.   PHYSICAL EXAM:  Blood pressure 172/83 in the left arm, heart rate 71 and  regular.  Temperature is 98.2.  She has a well-healed right above knee  amputation.  On the left lower extremity she has a 2+ left femoral  pulse.  She has absent popliteal and pedal pulses.  Inspection of the  left foot shows no areas of dry skin or ulceration.   Overall the patient appears to be doing well at this point.  She will  continue to protect the left lower extremity.  Since this is  asymptomatic I do not believe it needs any intervention at this point.  If she develops any nonhealing wounds we would consider whether or not  to revascularize the left leg or if she develops rest pain.  She will  follow up in 6 months' time with repeat ABIs or sooner if she has any  difficulty.     [name]. Fields, MD  Electronically Signed   CEF/MEDQ  D:  11/10/2009  T:  11/10/2009  Job:  [id number]

## 2010-08-16 NOTE — Assessment & Plan Note (Signed)
OFFICE VISIT   DORICE, STIGGERS  DOB:  1940/12/26                                       08/04/2009  VWUJW#:[id number]   The patient previously underwent a right femoral to anterior tibial  artery bypass on 06/07/2009.  She was discharged from the hospital and  then readmitted several days later at a [facility] with C. diff  colitis and failure to thrive.  At that point all of her sutures were  removed in [place] and she began to have separation of her wounds.  She  then represented to [facility] with failure to thrive and poorly  healing right lower extremity wounds.  She presents to the office today  for further followup.  She continues to complain of pain in her right  foot and heel.  She is currently in a skilled nursing facility.   PHYSICAL EXAM:  Today blood pressure is 150/76 in the left arm, heart  rate is 98 and regular.  Temperature is 97.9.  Right lower extremity she  has separation of the lateral calf wound which has some granulation  tissue within it.  There is no exposed graft.  She does have a  monophasic Doppler signal in the bypass graft above this incision.  She  has a faint anterior tibial artery Doppler signal at the level of the  ankle.  She has a large right heel decubitus which is 5.5 cm in diameter  with necrotic eschar.  The left foot is warm and well-perfused.  The  remainder of the incisions in her right leg are essentially healed at  this point.   I had a discussion with the patient today that really the only option at  this point is going to be a right above knee amputation.  She has a very  poor outflow vessel although the bypass graft is patent there was a  small conduit with a small output vessel and I do not believe we can  make this any better with any revisions.  I also do not believe she is a  candidate for any revisions with open wounds over the bypass graft  currently.  I discussed with the patient doing a  right above knee  amputation.  She wishes to think about this for a few days.  She was  given a prescription for Percocet today #30 dispensed.  We will most  likely end up doing a right above knee amputation on her next week.     [name]. Fields, MD  Electronically Signed   CEF/MEDQ  D:  08/04/2009  T:  08/05/2009  Job:  [id number]

## 2010-08-16 NOTE — Assessment & Plan Note (Signed)
OFFICE VISIT   Leah Hill, MODER  DOB:  December 08, 1940                                       06/02/2009  HQION#:[id number]   CHIEF COMPLAINT:  Right foot pain.   HISTORY OF PRESENT ILLNESS:  The patient is a 70 year old female  referred by Dr. Alona Bene for evaluation of a nonhealing right heel ulcer.  The patient states that she developed the ulceration in December of this  year and it has failed to heal and has gotten slightly larger.  It is  painful when walking on it.  Occasionally it has some drainage of brown  colored material.  She denies any fever or chills.  She does have  numbness and tingling in both feet, right foot greater than left.  She  has had a prior left greater saphenous vein harvest for coronary artery  bypass grafting.  She also describes symptoms consistent with  claudication, again right greater than left, at approximately 10-20  yards.  She denies tobacco use.  She does have a history of diabetes.   CHRONIC MEDICAL PROBLEMS:  Include diabetes, hypertension, elevated  cholesterol and congestive heart failure.  These are all currently under  control and take care of by her primary care doctor, Dr. Brent Bulla.   Past medical history is otherwise fairly unremarkable.   PAST SURGICAL HISTORY:  She had coronary artery bypass grafting in July  of 2009, she had a cholecystectomy in 1970 and she had a hysterectomy in  the 1970s.   FAMILY HISTORY:  Unremarkable.   SOCIAL HISTORY:  She is widowed.  She has one child.  She denies smoking  or alcohol use.   REVIEW OF SYSTEMS:  We performed a 12 point full review of systems with  her.  Please see intake referral form for details regarding this.   PHYSICAL EXAM:  Vital signs:  Blood pressure was 164/80 in the left arm,  heart rate 90 and regular.  Oxygen saturation 97% on room air.  HEENT:  Unremarkable.  Neck:  Has 2+ carotid pulses without bruit.  She has no  JVD.  Chest:  Clear to auscultation.   Cardiac:  Is regular rate and  rhythm without murmur.  She has a well-healed sternotomy scar.  Abdomen:  Is obese, soft, nontender, nondistended.  No masses.  Extremities:  She  has 1+ radial and 2+ femoral pulses bilaterally.  She has absent  popliteal and pedal pulses bilaterally.  She has no significant edema in  the lower extremities.  She has a 3 cm open ulceration on the lateral  aspect of the right heel which has dark eschar in the center.  There is  no erythema.  There is no significant drainage today.  Musculoskeletal:  Shows no significant major joint deformities.  Neurologic:  Shows  symmetric upper extremity and lower extremity motor strength which is  5/5.  She does have decreased sensation in the feet bilaterally.  Skin:  Has no rashes.  She has no cervical, inguinal or axillary adenopathy.   She had bilateral ABIs performed at [facility] on  05/14/2009.  This showed an ABI on the right side of 0.34 and on the  left of 0.59.   MEDICATIONS:  1. Include lisinopril 20 mg once a day.  2. Metoprolol 50 mg half tablet twice a day.  3. Cymbalta 60 mg  once a day.  4. Gabapentin 300 mg q.h.s.  5. Omeprazole 20 mg one before breakfast.  6. Aspirin 81 mg once a day.  7. Furosemide 20 mg once a day.  8. Lipitor 80 mg once a day.  9. Plavix 75 mg once a day.  10.Clarinex 5 mg once a day.  11.Insulin Levemir 38 units in the evening.  12.NovoLog 4 units three times daily before meals.   In summary, the patient has a nonhealing wound of her right lower  extremity.  She has a history consistent with claudication and has a  painful nonhealing wound in her right foot.  She also has ABIs  consistent with fairly severe arterial occlusive disease.  I believe the  best option at this point for the patient would be an arteriogram with  lower extremity runoff, possible angioplasty and stenting of her right  leg.  I discussed with the patient today risks, benefits,  possible  complications and procedure details regarding the arteriogram,  angioplasty and stenting.  She understands and agrees to proceed.  I  also informed her that if this is a lengthy lesion not amenable to  percutaneous revascularization we would need to possibly consider a  bypass procedure.  Her arteriogram is scheduled for Friday 06/04/2009.     [name]. Fields, MD  Electronically Signed   CEF/MEDQ  D:  06/02/2009  T:  06/03/2009  Job:  [id number]   cc:   Dr Brent Bulla  Dr Earna Coder  Charlynne Pander

## 2010-08-16 NOTE — Assessment & Plan Note (Signed)
OFFICE VISIT   KEENYA, MATERA  DOB:  [birth year]                                       03/24/2010  EAVWU#:[id number]   The patient returns for followup today.  She underwent a left femoral to  below knee popliteal bypass with PTFE on 03/08/2010.  She returns today  for further followup.  She states that the foot is much warmer and  overall she feels better.  Her incisions are slowly healing but she is  having some drainage from the medial aspect of her below knee incision.  This is serosanguineous in character.   She is currently residing in a skilled nursing facility and receiving  rehab.   PHYSICAL EXAM:  Blood pressure is 157/75 in the left arm, heart rate 73  and regular.  Temperature is 98.1.  Right leg has a well-healed above  knee amputation.  The left leg the groin incision has two areas of  separation.  They are very superficial in nature.  One is approximately  a centimeter and a half, the other one is a centimeter.  These have  healthy tissue at the area of separation.  There is no drainage.  There  is a small amount of serosanguineous drainage from the inferior aspect  of the left below knee incision.  I am not able to increase the amount  of fluid on squeezing the calf.  It is not purulent.  There is no  erythema around the incision.  The left foot is pink and warm.   She had an ABI performed on the left leg today which shows biphasic  Doppler flow and an ABI of 1.0.   Overall the patient is doing well.  I am concerned somewhat about the  drainage from the medial aspect of her left leg.  We started her on  doxycycline today.  She will follow up in 2 weeks to reassess the left  leg and the left groin wound.  Hopefully this will be a durable bypass  for her.  She is not really a candidate for any redo revascularization.  If the graft is infected it will most likely need to be removed and she  could be faced with an amputation at that  point.     [name]. Fields, MD  Electronically Signed   CEF/MEDQ  D:  03/24/2010  T:  03/25/2010  Job:  [id number]   cc:   Charlynne Pander

## 2010-08-16 NOTE — Assessment & Plan Note (Signed)
OFFICE VISIT   HARLEA, GOETZINGER  DOB:  10-Dec-1940                                       02/17/2010  ZOXWR#:[id number]   CHIEF COMPLAINT:  Left foot pain.   HISTORY OF PRESENT ILLNESS:  The patient is a 70 year old female with  known history of peripheral arterial disease.  She was last seen in  August of 2011.  At that time she was noted to have decreased ABIs on  the left side but was asymptomatic.  Since that time she has developed  constant pain in her left foot which is similar to the ischemia she had  in her right foot previously.  She previously underwent a right above  knee amputation after a fem-pop bypass.   She denies any ulcerations or nonhealing wounds on her foot.  She states  that she had some pain also from having her nails done in the doctor's  office recently.   She denies any claudication symptoms but is minimally ambulatory.  She  is currently working towards walking on an above knee prosthetic on the  right side.   CHRONIC MEDICAL PROBLEMS:  Include diabetes, hypertension, elevated  cholesterol, congestive failure.  These are all currently controlled and  followed by Dr. Alona Bene.   PAST SURGICAL HISTORY:  Hysterectomy, cholecystectomy, coronary artery  bypass grafting, above knee amputation on the right side and fem-pop on  the right side.   SOCIAL HISTORY:  She has one child.  She is widowed.  She is retired.  She is a nonsmoker, non consumer of alcohol.   FAMILY HISTORY:  Remarkable primarily for coronary artery disease and  diabetes.   REVIEW OF SYSTEMS:  Full 12 point review of systems was performed with  the patient today.  Please see intake referral form for details  regarding this.   MEDICATIONS:  Include lisinopril, metoprolol, Cymbalta, gabapentin,  omeprazole, aspirin, furosemide, Lipitor, Plavix, Clarinex and insulin.   ALLERGIES:  She has allergies listed to Neurontin and penicillin.  She  is off Neurontin but  thinks she may be on Lyrica now, she does not know  for sure.   PHYSICAL EXAM:  Vital signs:  Blood pressure is 179/67 in the left arm,  heart rate 76 and regular.  Temperature is 98.  HEENT:  Unremarkable.  Neck:  Has 2+ carotid pulses without bruit.  Chest:  Clear to  auscultation.  Cardiac:  Exam is regular rate and rhythm without murmur.  Abdomen:  Obese, soft, nontender, nondistended.  No masses.  She has 2+  femoral pulses bilaterally.  She has a well-healed right above knee  amputation.  She has absent popliteal and pedal pulses in the left foot.  There are no open ulcerations of the left foot.  The left foot is cool  and pale and poorly perfused in appearance.  Skin:  Has no open ulcers  or rashes.  Neurologic:  Exam shows symmetric upper extremity and lower  extremity motor strength which is 5/5.  Musculoskeletal:  Exam shows no  major obvious joint deformities.   She had a left-sided ABI today which shows the ABI has decreased again  on the left side and is now down to 0.35 with monophasic waveforms.  ABI  was 0.48 in August.   The patient has had progression of lower extremity occlusive disease in  the left  leg.  Her ABIs are in the range that she is at risk of tissue  loss.  I believe the best option for her at this point would be an  arteriogram with lower extremity runoff and possible angioplasty and  stenting.  We have scheduled this for 02/25/2010.  If she has extensive  disease we may need to consider whether or not a bypass would be in her  best interest in the left lower extremity.  All this was discussed with  the patient and her daughter today including but not limited to  bleeding, infection, contrast reaction and vessel injury, risk of  arteriogram.     [name]. Fields, MD  Electronically Signed   CEF/MEDQ  D:  02/17/2010  T:  02/18/2010  Job:  [id number]   cc:   Dr Alona Bene

## 2010-08-25 ENCOUNTER — Ambulatory Visit (INDEPENDENT_AMBULATORY_CARE_PROVIDER_SITE_OTHER): Payer: Medicare Other | Admitting: Vascular Surgery

## 2010-08-25 ENCOUNTER — Encounter (INDEPENDENT_AMBULATORY_CARE_PROVIDER_SITE_OTHER): Payer: Medicare Other

## 2010-08-25 DIAGNOSIS — I7092 Chronic total occlusion of artery of the extremities: Secondary | ICD-10-CM

## 2010-08-25 DIAGNOSIS — Z48812 Encounter for surgical aftercare following surgery on the circulatory system: Secondary | ICD-10-CM

## 2010-08-25 DIAGNOSIS — I70229 Atherosclerosis of native arteries of extremities with rest pain, unspecified extremity: Secondary | ICD-10-CM

## 2010-08-26 NOTE — Assessment & Plan Note (Signed)
OFFICE VISIT  Leah Hill, CODD DOB:  02-07-1941                                       08/25/2010 VHQIO#:[id number]  The patient is a 70 year old female who previously has undergone a right above knee amputation and also a left femoral to below knee popliteal bypass with Propaten.  She presents today for further followup.  She is not really ambulating at all on her right above knee amputation. Paperwork was filled out for the patient today for an Mining engineer wheelchair.  The patient and her family both state that her left leg is quite weak and deconditioned.  She is at very high fall risk and has difficulty moving just from the wheelchair to the bed.  The patient continues to have bouts of malnutrition and dehydration and was rehospitalized in May of this year in [place].  CHRONIC MEDICAL PROBLEMS:  Continue to remain diabetes, hypertension, elevated cholesterol.  The patient has had overall decline in her physical condition over the last year.  REVIEW OF SYSTEMS:  She has had some recent weight gain.  She is 5 feet 3 inches, 178 pounds. CARDIAC:  She has occasional chest pain.  She has shortness of breath with exertion and with lying flat sometimes. PULMONARY:  She is now on home oxygen.  PHYSICAL EXAM:  Vital signs:  Blood pressure is 137/69 in the right arm, heart rate 63 and regular, oxygen saturations 98% on 2 liters oxygen. Lower extremities:  Her right above knee amputation is well-healed. Left lower extremity she has a 1+ left dorsalis pedis pulse.  Foot is pink, warm, well-perfused.  No ulcerations.  She had a left ABI today which was 1.  Her bypass graft was patent with slightly increased velocity at the proximal anastomosis of 268 cm/s.  The patient is now 5 months status post a left femoral to below knee popliteal bypass.  She seems to be doing well with this and has a patent graft and palpable pulse in her foot.  She is overall fairly  debilitated and deconditioned.  Hopefully this will be a durable bypass graft for her.  I encouraged her and her family that she needs to continue to push herself physically to try to maintain what strength she has.  She may have some slight narrowing of the proximal bypass graft but I do not believe the velocities are high enough to warrant an intervention at this point.  I believe the best option would be continued observation with a repeat duplex exam in 3 months' time.  She will return for a lab visit at that time.    Leah Hill. Oday Ridings, MD Electronically Signed  CEF/MEDQ  D:  08/25/2010  T:  08/26/2010  Job:  [id number]  cc:   Charlynne Pander

## 2010-09-01 NOTE — Procedures (Unsigned)
BYPASS GRAFT EVALUATION  INDICATION:  Left lower extremity bypass graft.  HISTORY: Diabetes:  Yes. Cardiac:  Yes. Hypertension:  Yes. Smoking:  No. Previous Surgery:  Left fem-pop bypass graft on 03/08/2010, right above- knee amputation on 08/13/2009.  SINGLE LEVEL ARTERIAL EXAM                              RIGHT              LEFT Brachial:                    135                135 Anterior tibial:                                136 Posterior tibial:                               135 Peroneal: Ankle/brachial index:                           1.01  PREVIOUS ABI:  Date: 03/24/2010  RIGHT:  LEFT:  1.0  LOWER EXTREMITY BYPASS GRAFT DUPLEX EXAM:  DUPLEX:  Biphasic Doppler waveforms noted throughout the left lower extremity bypass graft with velocity of 268 cm/s noted at the proximal anastomosis level.  IMPRESSION: 1. Patent left femoropopliteal bypass graft with an increased velocity     noted, as described above. 2. The left ankle brachial index and Doppler waveforms suggest normal     perfusion of the left lower extremity.  ___________________________________________ [name] Fields, MD  CH/MEDQ  D:  08/25/2010  T:  08/25/2010  Job:  [id number]

## 2010-12-21 ENCOUNTER — Ambulatory Visit (INDEPENDENT_AMBULATORY_CARE_PROVIDER_SITE_OTHER): Payer: Medicare Other | Admitting: Vascular Surgery

## 2010-12-21 DIAGNOSIS — I739 Peripheral vascular disease, unspecified: Secondary | ICD-10-CM

## 2010-12-21 DIAGNOSIS — Z48812 Encounter for surgical aftercare following surgery on the circulatory system: Secondary | ICD-10-CM

## 2010-12-23 ENCOUNTER — Encounter: Payer: Self-pay | Admitting: Vascular Surgery

## 2010-12-23 NOTE — Procedures (Unsigned)
BYPASS GRAFT EVALUATION  INDICATION:  Follow up peripheral vascular disease.  HISTORY: Diabetes:  Yes. Cardiac:  Yes. Hypertension:  Yes. Smoking:  No. Previous Surgery:  Left femoral to popliteal bypass graft on 03/08/2010; right above-knee amputation on 08/13/2009.  SINGLE LEVEL ARTERIAL EXAM                              RIGHT              LEFT Brachial: Anterior tibial: Posterior tibial: Peroneal: Ankle/brachial index:  PREVIOUS ABI:  Date:  08/25/2010  RIGHT:  Right AKA  LEFT:  1.01  LOWER EXTREMITY BYPASS GRAFT DUPLEX EXAM:  DUPLEX: 1. Elevated velocities present involving the left distal external     iliac and profunda femoral artery, suggestive of 50% to 75%     stenosis. 2. Left common femoral artery stenosis present in the 30% to 49% range     with a peak systolic velocity of 190 cm/s.  IMPRESSION: 1. Patent left femoral-to-popliteal bypass graft. 2. Native artery stenosis present, as noted above. 3. Left ankle brachial index is 0.99 and considered in the normal     range with triphasic waveforms present.  ___________________________________________ [name]. Fields, MD  SH/MEDQ  D:  12/21/2010  T:  12/21/2010  Job:  [id number]

## 2011-01-05 ENCOUNTER — Other Ambulatory Visit: Payer: Self-pay | Admitting: Vascular Surgery

## 2011-01-05 DIAGNOSIS — Z48812 Encounter for surgical aftercare following surgery on the circulatory system: Secondary | ICD-10-CM

## 2011-01-05 DIAGNOSIS — I739 Peripheral vascular disease, unspecified: Secondary | ICD-10-CM

## 2011-03-23 ENCOUNTER — Other Ambulatory Visit: Payer: Medicare Other

## 2011-04-26 ENCOUNTER — Other Ambulatory Visit (INDEPENDENT_AMBULATORY_CARE_PROVIDER_SITE_OTHER): Payer: Medicare Other | Admitting: *Deleted

## 2011-04-26 DIAGNOSIS — Z48812 Encounter for surgical aftercare following surgery on the circulatory system: Secondary | ICD-10-CM

## 2011-04-26 DIAGNOSIS — I739 Peripheral vascular disease, unspecified: Secondary | ICD-10-CM

## 2011-05-04 ENCOUNTER — Encounter: Payer: Self-pay | Admitting: Vascular Surgery

## 2011-05-04 NOTE — Procedures (Unsigned)
BYPASS GRAFT EVALUATION  INDICATION:  Followup left fem-pop graft.  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 Patent left fem-below-knee-popliteal graft with mild diffuse     disease observed in the native inflow common femoral artery. 2. Waveforms are triphasic throughout the left lower extremity. 3. Please see worksheet for details.  IMPRESSION:  Patent left fem-pop graft with mild stenosis of the native common femoral arterial inflow.  ___________________________________________ [name] Fields, MD  LT/MEDQ  D:  04/26/2011  T:  04/26/2011  Job:  [id number]

## 2011-10-19 ENCOUNTER — Encounter: Payer: Self-pay | Admitting: Neurosurgery

## 2011-10-23 ENCOUNTER — Encounter: Payer: Self-pay | Admitting: Neurosurgery

## 2011-10-24 ENCOUNTER — Encounter (INDEPENDENT_AMBULATORY_CARE_PROVIDER_SITE_OTHER): Payer: Medicare Other | Admitting: *Deleted

## 2011-10-24 ENCOUNTER — Ambulatory Visit (INDEPENDENT_AMBULATORY_CARE_PROVIDER_SITE_OTHER): Payer: Medicare Other | Admitting: *Deleted

## 2011-10-24 ENCOUNTER — Encounter: Payer: Self-pay | Admitting: Neurosurgery

## 2011-10-24 ENCOUNTER — Ambulatory Visit (INDEPENDENT_AMBULATORY_CARE_PROVIDER_SITE_OTHER): Payer: Medicare Other | Admitting: Neurosurgery

## 2011-10-24 VITALS — BP 160/82 | HR 72 | Resp 18 | Ht 63.0 in | Wt 195.0 lb

## 2011-10-24 DIAGNOSIS — I739 Peripheral vascular disease, unspecified: Secondary | ICD-10-CM

## 2011-10-24 DIAGNOSIS — Z48812 Encounter for surgical aftercare following surgery on the circulatory system: Secondary | ICD-10-CM

## 2011-10-24 NOTE — Addendum Note (Signed)
Addended by: Sharee Pimple on: 10/24/2011 11:07 AM   Modules accepted: Orders

## 2011-10-24 NOTE — Progress Notes (Signed)
[facility] Office Note  CC: Annual ABIs and lower arterial graft duplex Referring Physician: Fields  History of Present Illness: 71 year old female patient of Dr. 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 The patient denies left lower extremity pain, the patient does not ambulate however she denies rest pain and has no open ulcerations on the left lower extremity.  Past Medical History  Diagnosis Date  . Arthritis   . CHF (congestive heart failure)   . Hypertension   . Hyperlipidemia   . Peripheral vascular disease   . Diabetes mellitus   . Colitis, Clostridium difficile   . CAD (coronary artery disease)     ROS: [x]  Positive   [ ]  Denies    General: [ ]  Weight loss, [ ]  Fever, [ ]  chills Neurologic: [ ]  Dizziness, [ ]  Blackouts, [ ]  Seizure [ ]  Stroke, [ ]  "Mini stroke", [ ]  Slurred speech, [ ]  Temporary blindness; [ x] numbness/weakness in arms or legs, [ ]  Hoarseness Cardiac: [ ]  Chest pain/pressure, [ ]  Shortness of breath at rest [ ]  Shortness of breath with exertion, [ ]  Atrial fibrillation or irregular heartbeat Vascular: [ ]  Pain in legs with walking, [x ] Pain in legs at rest, [ ]  Pain in legs at night,  [ ]  Non-healing ulcer, [ ]  Blood clot in vein/DVT,  LLE edema Pulmonary: [ ]  Home oxygen, [ ]  Productive cough, [ ]  Coughing up blood, [ ]  Asthma,  [ ]  Wheezing Musculoskeletal:  [ ]  Arthritis, [ ]  Low back pain, [ ]  Joint pain Hematologic: [ ]  Easy Bruising, [ ]  Anemia; [ ]  Hepatitis Gastrointestinal: [ ]  Blood in stool, [ ]  Gastroesophageal Reflux/heartburn, [ ]  Trouble swallowing Urinary: [ ]  chronic Kidney disease, [ ]  on HD - [ ]  MWF or [ ]  TTHS, [ ]  Burning with urination, [ ]  Difficulty urinating Skin: [ ]  Rashes, [ ]  Wounds Psychological: [ ]  Anxiety, [ ]  Depression   Social History History  Substance Use Topics  . Smoking status: Never Smoker   .  Smokeless tobacco: Not on file  . Alcohol Use: No    Family History Family History  Problem Relation Age of Onset  . Diabetes Mother   . Hypertension Mother   . Heart disease Mother   . Diabetes Father   . Diabetes Sister   . Diabetes Brother     BKA  . Cancer Maternal Grandmother   . Diabetes Maternal Grandmother     Allergies  Allergen Reactions  . Neurontin (Gabapentin)   . Penicillins     Current Outpatient Prescriptions  Medication Sig Dispense Refill  . aspirin 81 MG tablet Take 81 mg by mouth daily.      Marland Kitchen atorvastatin (LIPITOR) 80 MG tablet Take 80 mg by mouth daily.      . clopidogrel (PLAVIX) 75 MG tablet Take 75 mg by mouth daily.      Marland Kitchen docusate sodium (COLACE) 100 MG capsule Take 100 mg by mouth daily.      . DULoxetine (CYMBALTA) 60 MG capsule Take 60 mg by mouth daily.      . Insulin Glargine (LANTUS [place]) Inject 25 Units into the skin at bedtime.      . Insulin Lispro, Human, (HUMALOG [place]) Inject 4 Units into the skin 3 (three) times daily.      Marland Kitchen losartan-hydrochlorothiazide (HYZAAR) 100-25 MG per tablet  Take by mouth 2 (two) times daily.      . metoprolol tartrate (LOPRESSOR) 25 MG tablet Take 25 mg by mouth 2 (two) times daily.      Marland Kitchen NOVOFINE 30G X 8 MM MISC daily.      Marland Kitchen omeprazole (PRILOSEC) 20 MG capsule Take 20 mg by mouth daily.      . pregabalin (LYRICA) 50 MG capsule Take 50 mg by mouth 2 (two) times daily.      . ranitidine (ZANTAC) 150 MG capsule Take 150 mg by mouth 2 (two) times daily.      . silver sulfADIAZINE (SILVADENE) 1 % cream Apply topically as needed.        Physical Examination  Filed Vitals:   10/24/11 1010  BP: 160/82  Pulse: 72  Resp: 18    Body mass index is 34.54 kg/(m^2).  General:  WDWN in NAD Gait: Normal HEENT: WNL Eyes: Pupils equal Pulmonary: normal non-labored breathing , without Rales, rhonchi,  wheezing Cardiac: RRR, without  Murmurs, rubs or gallops; No carotid bruits Abdomen: soft, NT, no masses Skin:  no rashes, ulcers noted Vascular Exam/Pulses: Palpable PT and DP pulse on the left  Extremities without ischemic changes, no Gangrene , no cellulitis; no open wounds;  Musculoskeletal: no muscle wasting or atrophy  Neurologic: A&O X 3; Appropriate Affect ; SENSATION: normal; MOTOR FUNCTION:  moving all extremities equally. Speech is fluent/normal  Non-Invasive Vascular Imaging: ABI today on the left is 0.93 and biphasic, previous ABI in January 2013 was 1.01. Bypass graft evaluation shows a patent left femoropopliteal graft  ASSESSMENT/PLAN: Patient doing well with her left femoropopliteal bypass graft. The patient will followup in one year with repeat ABIs and graft duplex. The patient's questions were encouraged and answered.  Lauree Chandler ANP  Clinic M.D.: [place]

## 2011-11-02 NOTE — Procedures (Unsigned)
BYPASS GRAFT EVALUATION  INDICATION:  Followup left femoral-to-popliteal bypass graft.  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 Patent left femoral-to-below-knee-popliteal graft with mild diffuse     disease observed in the native inflow common femoral artery. 2. Waveforms are biphasic throughout the left lower extremity. 3. Please see the following worksheet for exam details.  IMPRESSION:  Patent left femoral-to-popliteal graft with mild stenosis of the native common femoral arterial inflow.  ___________________________________________ [name] Fields, MD  EM/MEDQ  D:  10/25/2011  T:  10/25/2011  Job:  [id number]

## 2012-01-25 IMAGING — CR DG CHEST 2V
2 series · 2 of 2 positions shown · non-contrast
Comparison: None.

CLINICAL DATA: Preop for vascular surgery

CHEST - 2 VIEW

[view not recorded (1 of 2)]
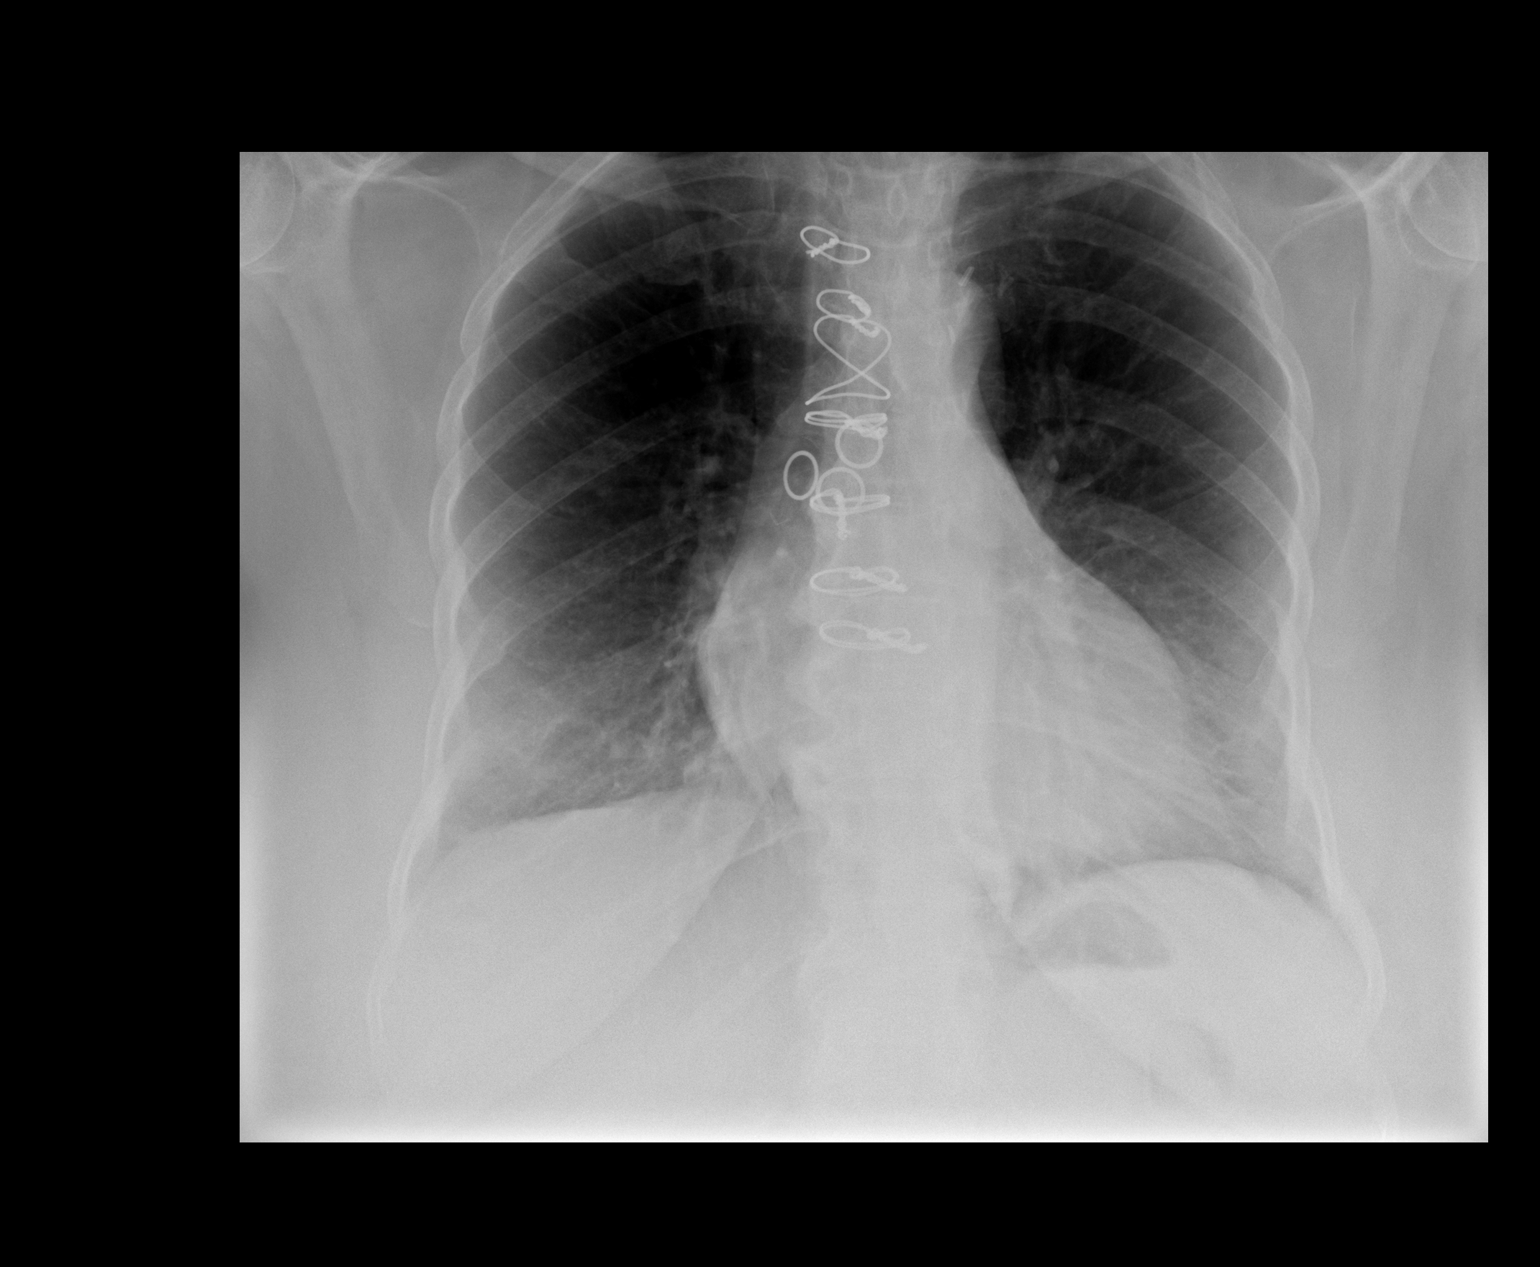

[view not recorded (2 of 2)]
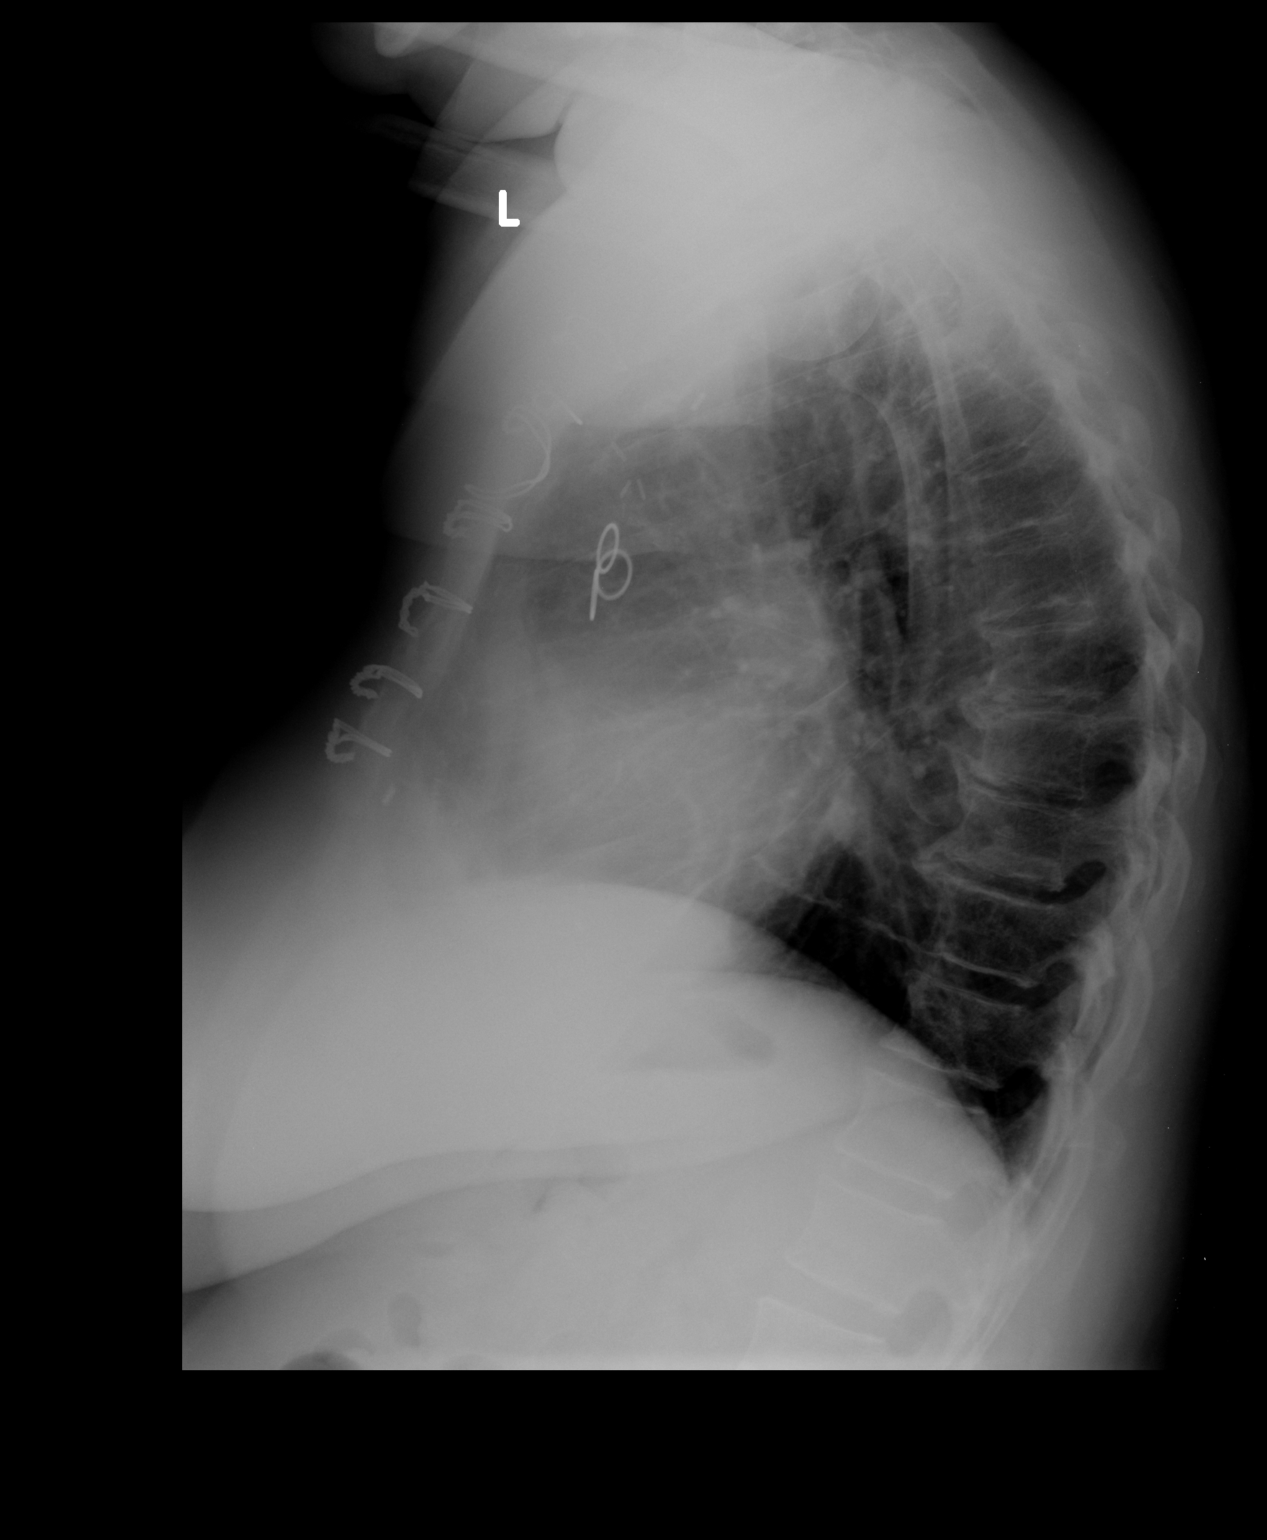

[2 of 2 positions shown; findings below may reference images not displayed]

FINDINGS: The lungs are clear.  There is mild cardiomegaly present.
Median sternotomy sutures are noted from prior CABG.  There are
degenerative changes throughout the thoracic spine.
IMPRESSION: Cardiomegaly.  No active lung disease.

## 2012-01-30 IMAGING — US US RENAL
1 series · 14 of 25 positions shown · non-contrast
Comparison: None.

CLINICAL DATA: History given of elevated creatinine level.

RENAL/URINARY TRACT ULTRASOUND COMPLETE

[Series 1: us renal · 0.31mm/px · 14 of 31 slices shown]
[im 1/31]
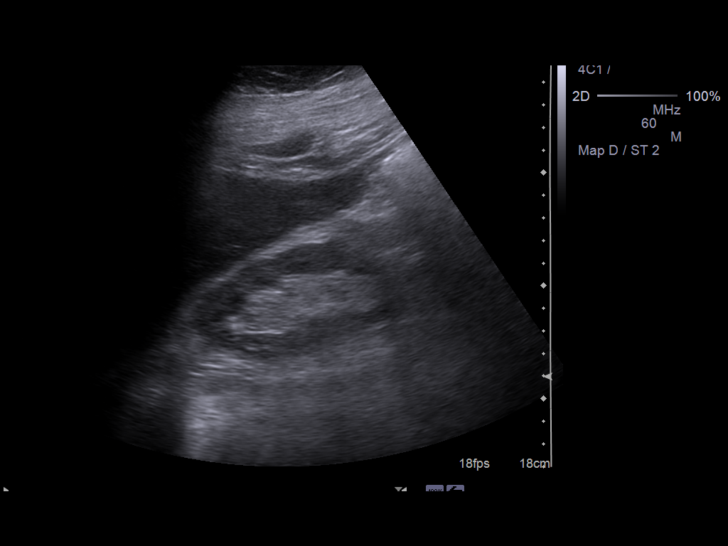
[im 3/31]
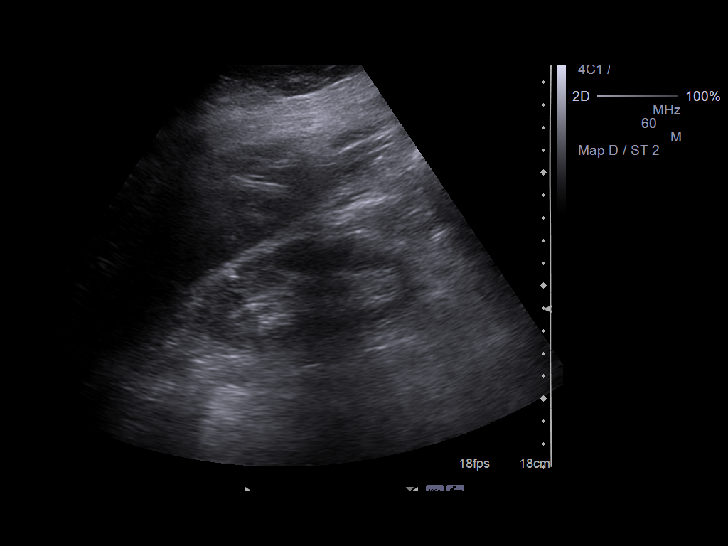
[im 6/31]
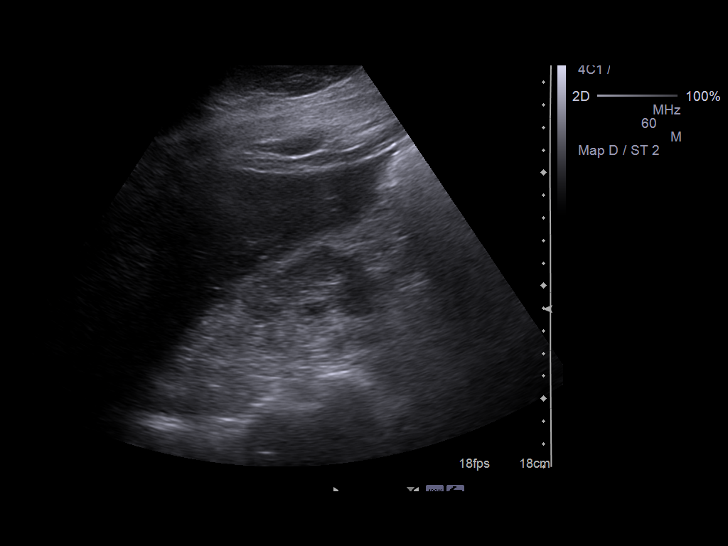
[im 8/31]
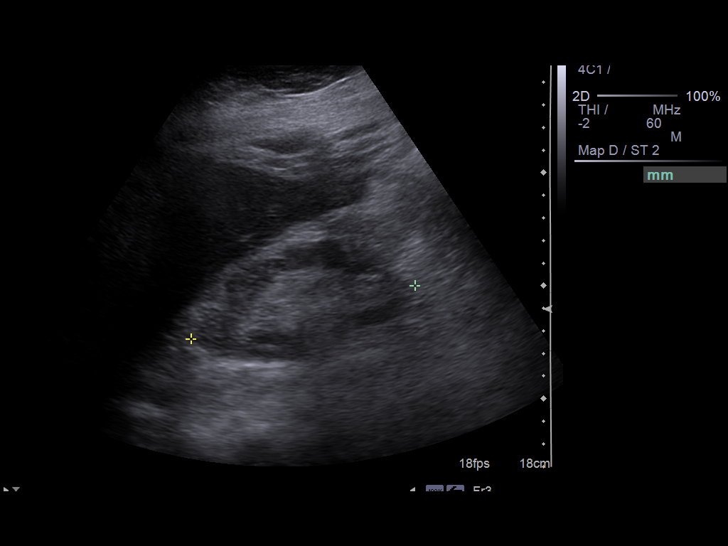
[im 11/31]
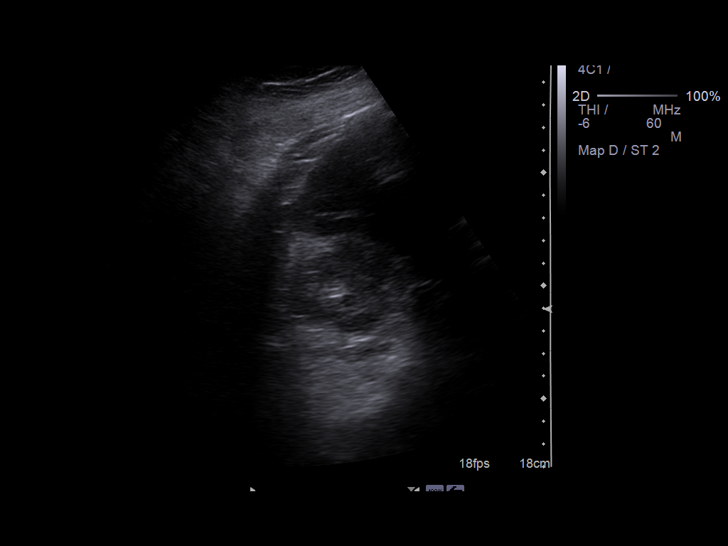
[im 12/31]
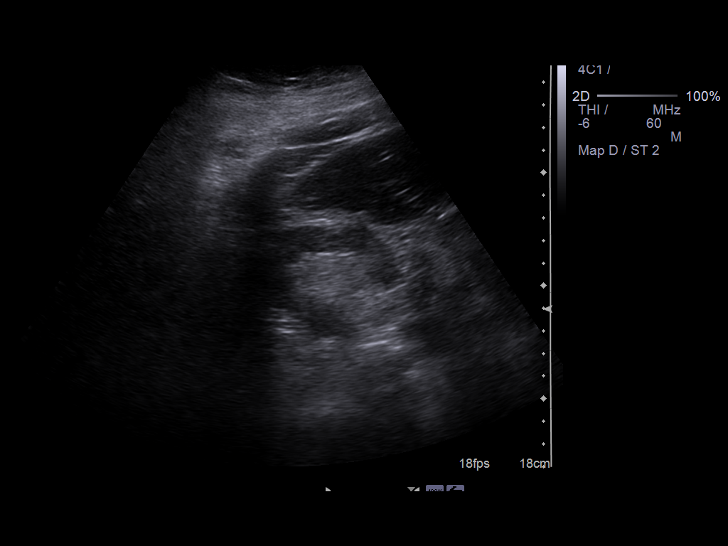
[im 14/31]
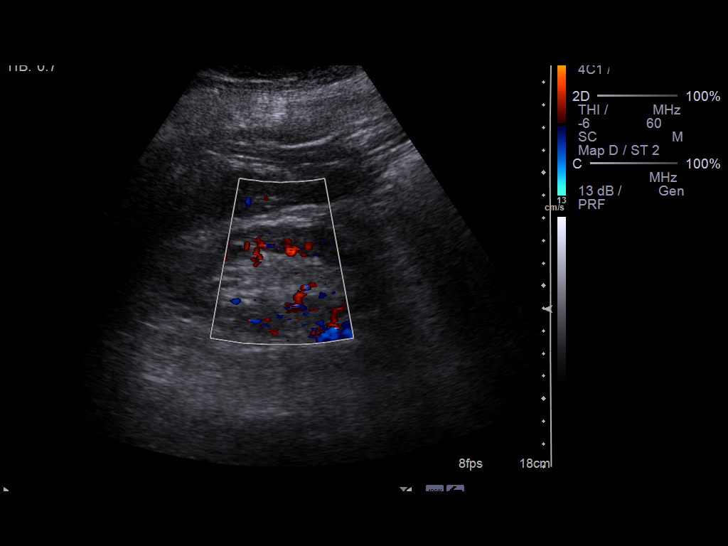
[im 17/31]
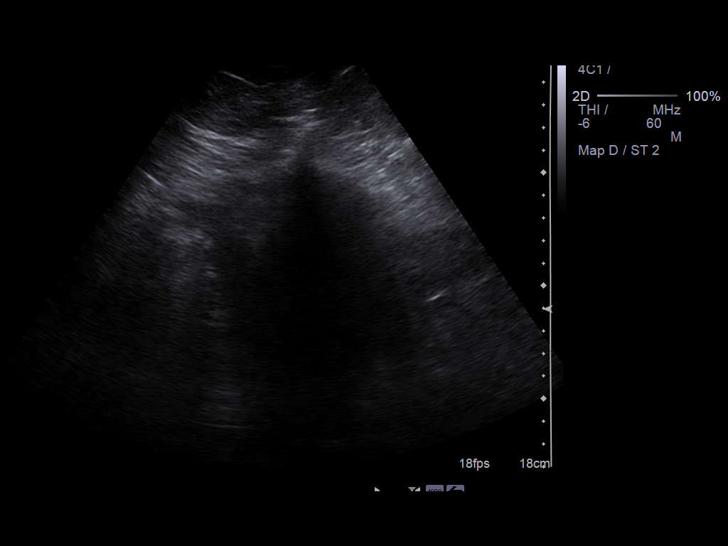
[im 19/31]
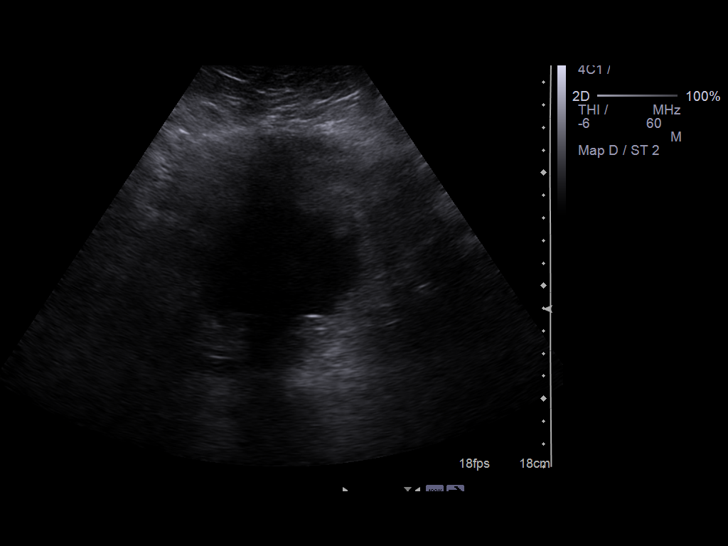
[im 21/31]
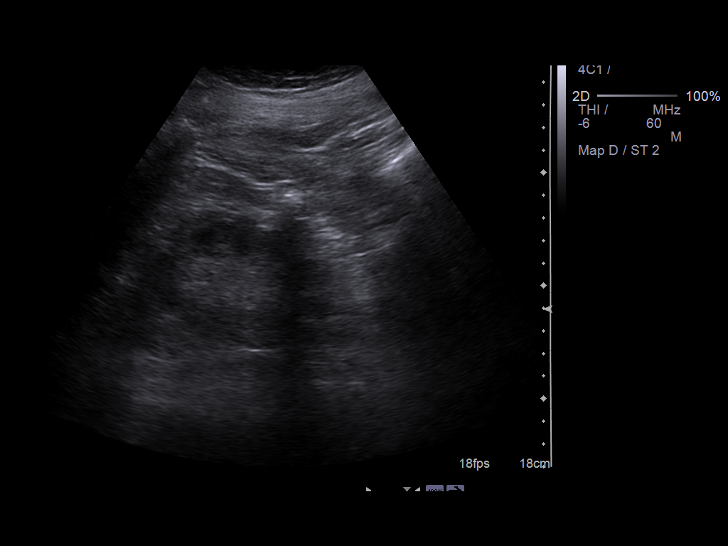
[im 23/31]
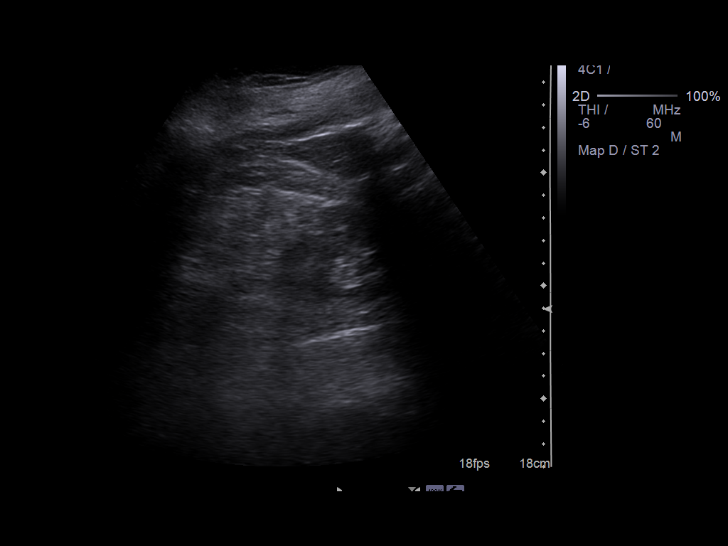
[im 26/31]
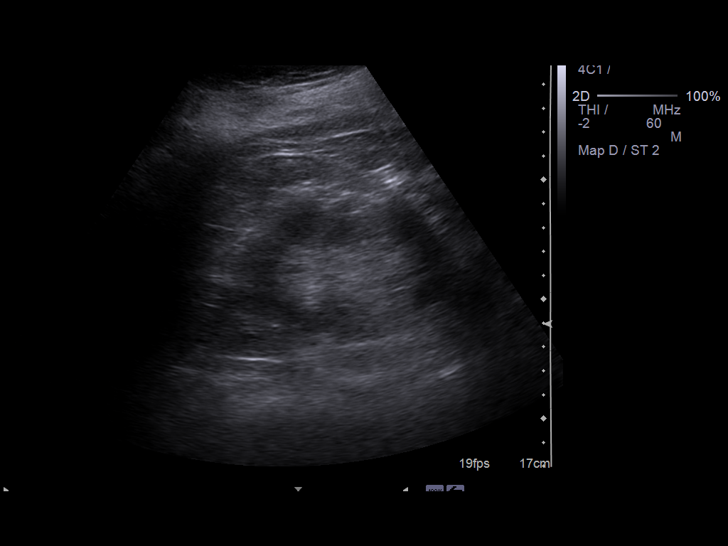
[im 28/31]
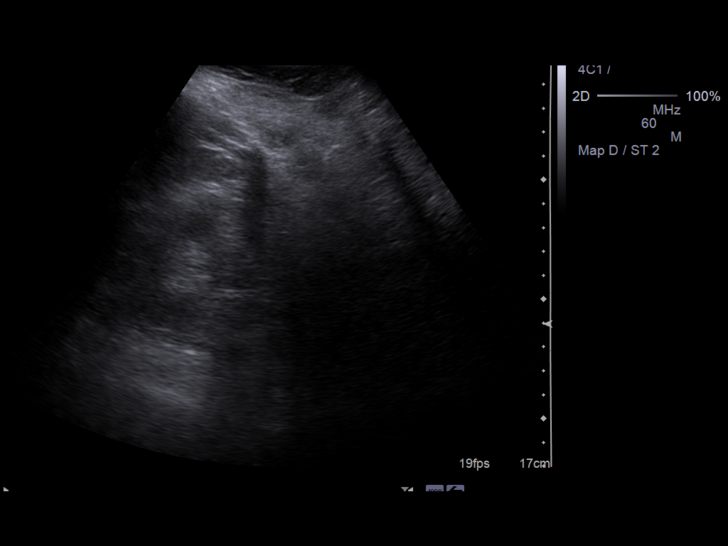
[im 31/31]
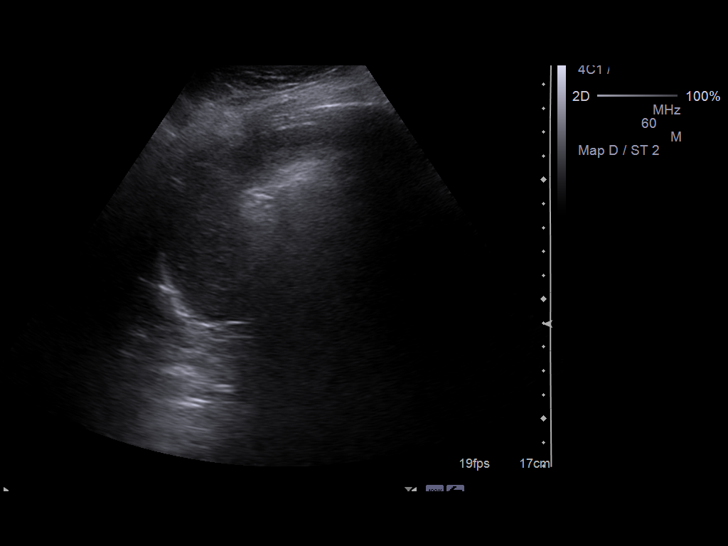

[14 of 25 positions shown; findings below may reference images not displayed]

FINDINGS: Right Kidney:  Right renal length is 11.6 cm.

Left Kidney:  Left renal length is 9.6 cm. The left kidney is small
with some parenchymal loss.

There is increased echogenicity of renal parenchyma which may be
associated with medical renal disease.

No hydronephrosis, solid or cystic mass or calculus was evident.

Bladder:  Bladder is decompressed with Foley catheter.

Incidental note is made of ascites.
IMPRESSION: Left kidney is small with some parenchymal loss.  There is
increased echogenicity of renal parenchyma which may be associated
medical renal disease.  No hydronephrosis or mass is seen.
Incidental note is made of ascites.

## 2012-03-06 IMAGING — CR DG CHEST 1V PORT
1 series · 1 of 1 positions shown · non-contrast
Comparison: 06/04/2009

CLINICAL DATA: Nausea, failure to thrive.

PORTABLE CHEST - 1 VIEW

[AP]
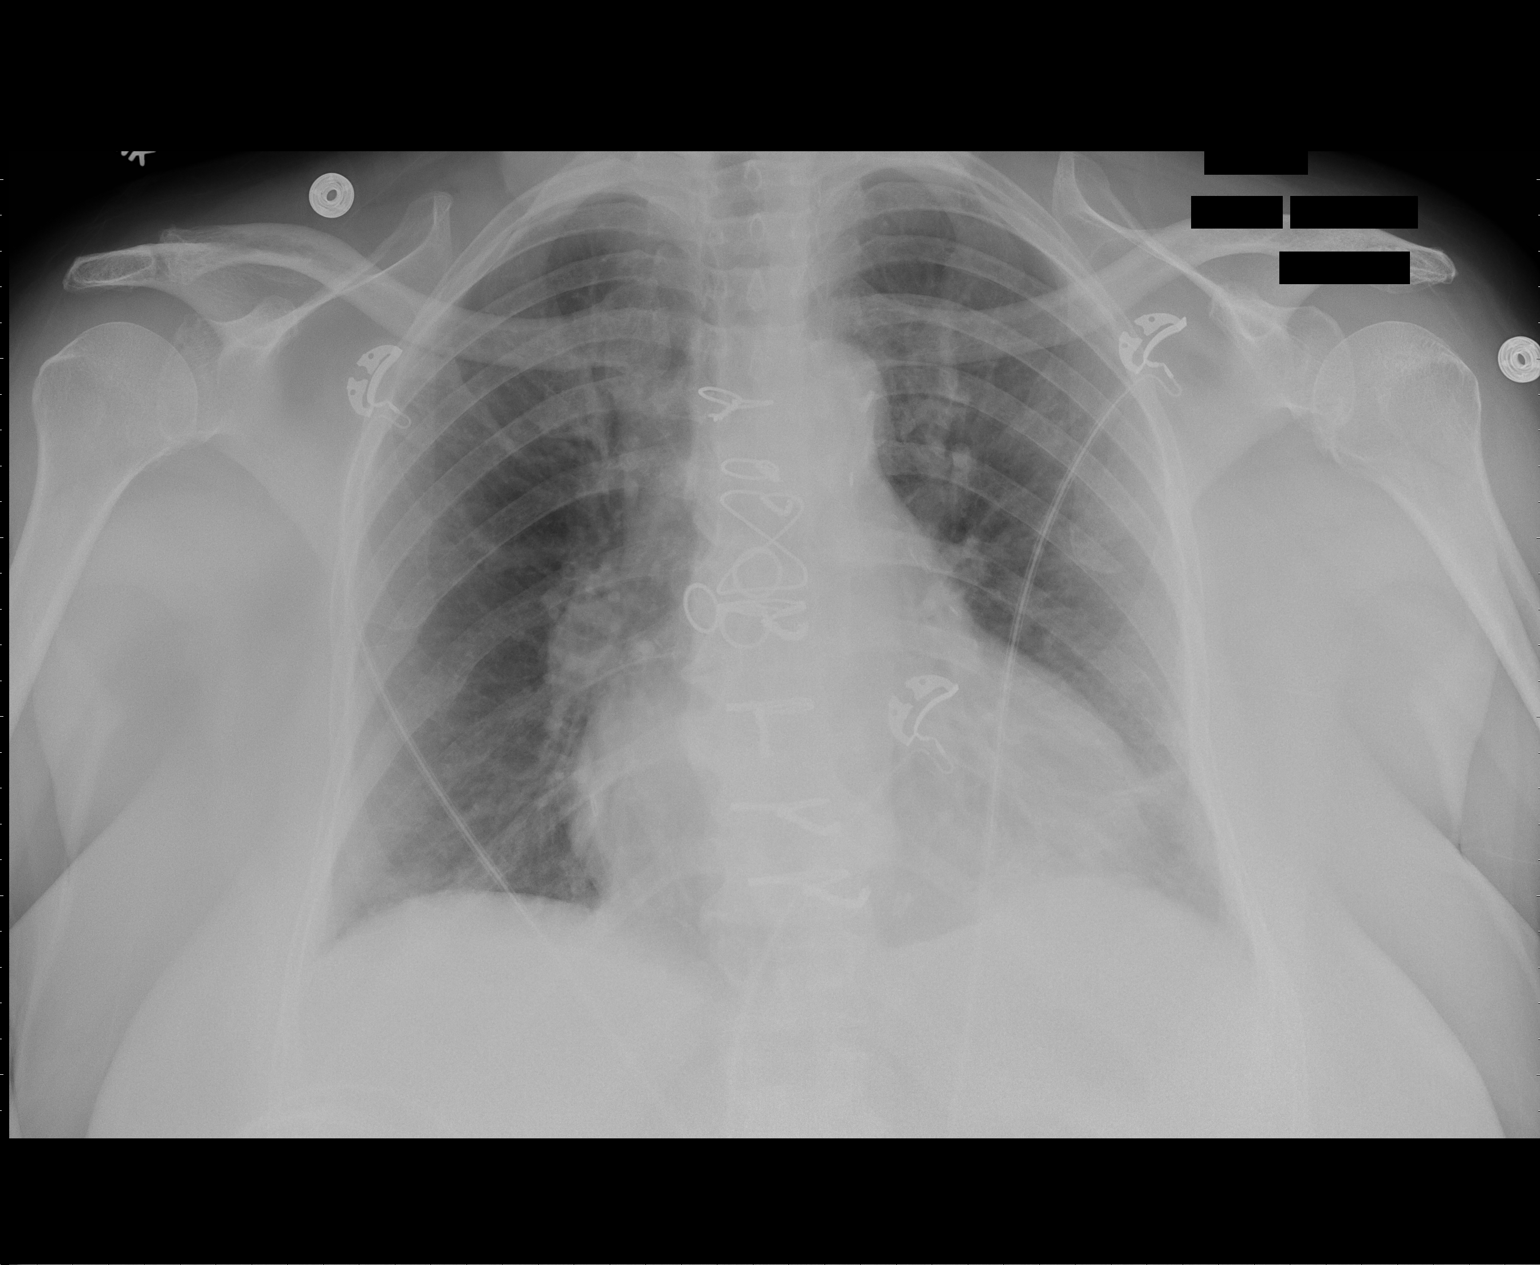

[1 of 1 positions shown; findings below may reference images not displayed]

FINDINGS: Prior CABG.  There is mild cardiomegaly.  Lingular
atelectasis or scarring noted.  Right lung is clear.  No effusions
or acute bony abnormality.
IMPRESSION: Cardiomegaly.  Lingular scarring or atelectasis.

## 2012-10-21 ENCOUNTER — Encounter (INDEPENDENT_AMBULATORY_CARE_PROVIDER_SITE_OTHER): Payer: Medicare Other | Admitting: *Deleted

## 2012-10-21 DIAGNOSIS — I739 Peripheral vascular disease, unspecified: Secondary | ICD-10-CM

## 2012-10-21 DIAGNOSIS — Z48812 Encounter for surgical aftercare following surgery on the circulatory system: Secondary | ICD-10-CM

## 2012-10-22 ENCOUNTER — Other Ambulatory Visit: Payer: Self-pay | Admitting: *Deleted

## 2012-10-22 DIAGNOSIS — I739 Peripheral vascular disease, unspecified: Secondary | ICD-10-CM

## 2012-10-22 DIAGNOSIS — Z48812 Encounter for surgical aftercare following surgery on the circulatory system: Secondary | ICD-10-CM

## 2012-10-23 ENCOUNTER — Ambulatory Visit: Payer: Medicare Other | Admitting: Neurosurgery

## 2012-10-23 ENCOUNTER — Encounter: Payer: Self-pay | Admitting: Vascular Surgery

## 2013-10-22 ENCOUNTER — Encounter: Payer: Self-pay | Admitting: Family

## 2013-10-23 ENCOUNTER — Encounter (HOSPITAL_COMMUNITY): Payer: Medicare Other

## 2013-10-23 ENCOUNTER — Other Ambulatory Visit (HOSPITAL_COMMUNITY): Payer: Medicare Other

## 2013-10-23 ENCOUNTER — Ambulatory Visit: Payer: Medicare Other | Admitting: Family

## 2013-11-12 ENCOUNTER — Encounter: Payer: Self-pay | Admitting: Family

## 2013-11-13 ENCOUNTER — Ambulatory Visit (INDEPENDENT_AMBULATORY_CARE_PROVIDER_SITE_OTHER)
Admission: RE | Admit: 2013-11-13 | Discharge: 2013-11-13 | Disposition: A | Discharge status: Home / Self Care | Payer: Medicare Other | Source: Ambulatory Visit | Attending: Vascular Surgery | Admitting: Vascular Surgery

## 2013-11-13 ENCOUNTER — Ambulatory Visit (HOSPITAL_COMMUNITY)
Admission: RE | Admit: 2013-11-13 | Discharge: 2013-11-13 | Disposition: A | Discharge status: Home / Self Care | Payer: Medicare Other | Source: Ambulatory Visit | Attending: Family | Admitting: Family

## 2013-11-13 ENCOUNTER — Encounter: Payer: Self-pay | Admitting: Family

## 2013-11-13 ENCOUNTER — Ambulatory Visit (INDEPENDENT_AMBULATORY_CARE_PROVIDER_SITE_OTHER): Payer: Medicare Other | Admitting: Family

## 2013-11-13 VITALS — BP 149/70 | HR 72 | Resp 16 | Ht 63.0 in | Wt 190.0 lb

## 2013-11-13 DIAGNOSIS — Z48812 Encounter for surgical aftercare following surgery on the circulatory system: Secondary | ICD-10-CM

## 2013-11-13 DIAGNOSIS — R200 Anesthesia of skin: Secondary | ICD-10-CM

## 2013-11-13 DIAGNOSIS — R209 Unspecified disturbances of skin sensation: Secondary | ICD-10-CM

## 2013-11-13 DIAGNOSIS — I739 Peripheral vascular disease, unspecified: Secondary | ICD-10-CM

## 2013-11-13 NOTE — Progress Notes (Signed)
[facility] [place] HISTORY AND PHYSICAL -[facility]  History of Present Illness Leah Hill is a 73 y.o. female patient of Dr. 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  She returns today for follow up. She rarely walks with a walker, mostly is in her wheelchair, denies non healing wounds. Does not use her prosthesis. She denies any history of stroke or TIA.  Pt Diabetic: Yes, reports that her sugars are better since her insulin was changed Pt smoker: non-smoker  Pt meds include: Statin :Yes ASA: Yes Other anticoagulants/antiplatelets: Plavix  Past Medical History  Diagnosis Date  . Arthritis   . CHF (congestive heart failure)   . Hypertension   . Hyperlipidemia   . Peripheral vascular disease   . Diabetes mellitus   . Colitis, Clostridium difficile   . CAD (coronary artery disease)   . Atrial fibrillation   . DVT (deep venous thrombosis)   . Chronic kidney disease   . Myocardial infarction     Social History History  Substance Use Topics  . Smoking status: Never Smoker   . Smokeless tobacco: Never Used  . Alcohol Use: No    Family History Family History  Problem Relation Age of Onset  . Diabetes Mother     Amputation  . Hypertension Mother   . Heart disease Mother     Before age 72  . Hyperlipidemia Mother   . Heart attack Mother   . Peripheral vascular disease Mother   . Diabetes Father   . Diabetes Sister   . Heart disease Sister   . Heart attack Sister   . Diabetes Brother     BKA  . Heart attack Brother   . Cancer Maternal Grandmother   . Diabetes Maternal Grandmother     Past Surgical History  Procedure Laterality Date  . Cholecystectomy      Gall bladder  . Abdominal hysterectomy    . Femoral bypass  03/08/2010    Left Fem-Pop  . Above knee leg amputation  08/13/2009    [place]  AKA  . Coronary artery bypass graft  2009    Allergies  Allergen  Reactions  . Penicillins Hives  . Neurontin [Gabapentin] Other (See Comments)    Trimmers    Current Outpatient Prescriptions  Medication Sig Dispense Refill  . aspirin 81 MG tablet Take 81 mg by mouth daily.      Marland Kitchen. atorvastatin (LIPITOR) 80 MG tablet Take 80 mg by mouth daily.      . clopidogrel (PLAVIX) 75 MG tablet Take 75 mg by mouth daily.      . DULoxetine (CYMBALTA) 60 MG capsule Take 60 mg by mouth daily.      Marland Kitchen. esomeprazole (NEXIUM) 40 MG capsule Take 40 mg by mouth daily at 12 noon.      . furosemide (LASIX) 20 MG tablet Take 20 mg by mouth daily.      . Insulin Glargine (LANTUS [place]) Inject 25 Units into the skin at bedtime.      . Insulin Lispro, Human, (HUMALOG ) Inject 4 Units into the skin 3 (three) times daily.      Marland Kitchen. losartan-hydrochlorothiazide (HYZAAR) 100-25 MG per tablet Take by mouth 2 (two) times daily.      . metoprolol tartrate (LOPRESSOR) 25 MG tablet Take 25 mg by mouth 2 (two) times daily.      . nitroGLYCERIN (NITROSTAT) 0.4  MG SL tablet Place 0.4 mg under the tongue every 5 (five) minutes as needed for chest pain.      Marland Kitchen NOVOFINE 30G X 8 MM MISC daily.      . pregabalin (LYRICA) 50 MG capsule Take 50 mg by mouth 2 (two) times daily.      Marland Kitchen docusate sodium (COLACE) 100 MG capsule Take 100 mg by mouth daily.      Marland Kitchen omeprazole (PRILOSEC) 20 MG capsule Take 20 mg by mouth daily.      . ranitidine (ZANTAC) 150 MG capsule Take 150 mg by mouth 2 (two) times daily.      . silver sulfADIAZINE (SILVADENE) 1 % cream Apply topically as needed.       No current facility-administered medications for this visit.    ROS: See HPI for pertinent positives and negatives.   Physical Examination  Filed Vitals:   11/13/13 1222  BP: 149/70  Pulse: 72  Resp: 16  Height: 5\' 3"  (1.6 m)  Weight: 190 lb (86.183 kg)  SpO2: 96%   Body mass index is 33.67 kg/(m^2).  General: A&O x 3, WDWN, obese female. Gait: in wheelchair Eyes: Pupils equal Pulmonary: CTAB, without  wheezes , rales or rhonchi. Cardiac: regular Rythm , without detected murmur.         Carotid Bruits Right Left   Negative Negative  Aorta is not palpable. Radial pulses: 1+ palpable and =                           VASCULAR EXAM: Extremities without ischemic changes  without Gangrene; without open wounds. Right AKA, well healed stump, no ulcerations                                                                                                          LE Pulses Right Left       FEMORAL  not palpable sitting in w/c  not palpable sitting in w/c        POPLITEAL AKA  not palpable       POSTERIOR TIBIAL  AKA   not palpable        DORSALIS PEDIS      ANTERIOR TIBIAL AKA not palpable    Abdomen: soft, NT, no masses, obese. Skin: no rashes, no ulcers noted. Musculoskeletal: no muscle wasting or atrophy. Right AKA.  Neurologic: A&O X 3; Appropriate Affect ; SENSATION: normal; MOTOR FUNCTION:  moving all extremities equally, motor strength 3/5 throughout. Speech is fluent/normal. CN 2-12 intact.    Non-Invasive Vascular Imaging: DATE: 11/13/2013 LOWER EXTREMITY ARTERIAL DUPLEX EVALUATION    INDICATION: Peripheral vascular disease     PREVIOUS INTERVENTION(S): Left femoral-popliteal artery bypass graft 03/08/2010. Right above knee amputation 08/13/2009.    DUPLEX EXAM:     RIGHT  LEFT   Peak Systolic Velocity (cm/s) Ratio (if abnormal) Waveform  Peak Systolic Velocity (cm/s) Ratio (if abnormal) Waveform     Inflow Artery 180  B     Proximal Anastomosis 168  B  Proximal Graft 68  B     Mid Graft 64  B      Distal Graft 40  B     Distal Anastomosis 57/52  B/B     Outflow Artery 35/17/84  M/M/M  AKA Today's ABI / TBI 0.72/0.62  AKA Previous ABI / TBI (10/21/2012  ) 0.96/0.57    Waveform:    M - Monophasic       B - Biphasic       T - Triphasic  If Ankle Brachial Index (ABI) or Toe Brachial Index (TBI) performed, please see complete report  ADDITIONAL FINDINGS:      IMPRESSION: Elevated velocities and heterogeneous plaque present involving the left arterial inflow with disturbed flow suggestive of a more proximal disease process. Patent left femoral-popliteal artery bypass graft, no hemodynamically significant changes evident. Monophasic spectral waveforms present involving the left calf arteries suggestive of small vessel disease.    Compared to the previous exam:  Decrease in ankle brachial index of the left lower extremity since previous study on 10/21/2012.    ASSESSMENT: Leah Hill is a 73 y.o. 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  Pt rarely walks, gets around mostly by wheelchair, denied non healing wounds. Left LE arterial Duplex today demonstrates elevated velocities and heterogeneous plaque involving the left arterial inflow with disturbed flow suggestive of a more proximal disease process. Patent left femoral-popliteal artery bypass graft, no hemodynamically significant changes evident. Monophasic spectral waveforms present involving the left calf arteries suggestive of small vessel disease. Decrease in ankle brachial index of the left lower extremity since previous study on 10/21/2012.  PLAN:  I discussed in depth with the patient the nature of atherosclerosis, and emphasized the importance of maximal medical management including strict control of blood pressure, blood glucose, and lipid levels, obtaining regular exercise, and continued cessation of smoking.  The patient is aware that without maximal medical management the underlying atherosclerotic disease process will progress, limiting the benefit of any interventions.  Since she does not walk she cannot participate in a graduated walking program. Seated leg and arm exercises demonstrated and discussed, to be done daily, pt's daughter and home health aid present to remind pt.  Based on the patient's vascular  studies and examination, pt will return to clinic in 6 months left lower extremity arterial Duplex and ABI left leg.  The patient was given information about PAD including signs, symptoms, treatment, what symptoms should prompt the patient to seek immediate medical care, and risk reduction measures to take.  Charisse March, RN, MSN, FNP-C [facility] Phone: [phone]  Clinic MD: Imogene Burn  11/13/2013 12:41 PM

## 2013-11-13 NOTE — Addendum Note (Signed)
Addended by: Sharee PimpleMCCHESNEY, Winta Barcelo K on: 11/13/2013 05:02 PM   Modules accepted: Orders

## 2014-05-14 ENCOUNTER — Ambulatory Visit: Payer: Medicare Other | Admitting: Family

## 2014-05-14 ENCOUNTER — Encounter (HOSPITAL_COMMUNITY): Payer: Medicare Other

## 2014-05-14 ENCOUNTER — Other Ambulatory Visit (HOSPITAL_COMMUNITY): Payer: Medicare Other

## 2014-06-02 ENCOUNTER — Encounter: Payer: Self-pay | Admitting: Family

## 2014-06-03 ENCOUNTER — Ambulatory Visit (INDEPENDENT_AMBULATORY_CARE_PROVIDER_SITE_OTHER)
Admission: RE | Admit: 2014-06-03 | Discharge: 2014-06-03 | Disposition: A | Discharge status: Home / Self Care | Payer: Medicare Other | Source: Ambulatory Visit | Attending: Family | Admitting: Family

## 2014-06-03 ENCOUNTER — Encounter: Payer: Self-pay | Admitting: Family

## 2014-06-03 ENCOUNTER — Ambulatory Visit (INDEPENDENT_AMBULATORY_CARE_PROVIDER_SITE_OTHER): Payer: Medicare Other | Admitting: Family

## 2014-06-03 ENCOUNTER — Ambulatory Visit (HOSPITAL_COMMUNITY)
Admission: RE | Admit: 2014-06-03 | Discharge: 2014-06-03 | Disposition: A | Discharge status: Home / Self Care | Payer: Medicare Other | Source: Ambulatory Visit | Attending: Family | Admitting: Family

## 2014-06-03 VITALS — BP 170/82 | HR 79 | Resp 16 | Ht 63.0 in | Wt 193.0 lb

## 2014-06-03 DIAGNOSIS — Z95828 Presence of other vascular implants and grafts: Secondary | ICD-10-CM

## 2014-06-03 DIAGNOSIS — E1151 Type 2 diabetes mellitus with diabetic peripheral angiopathy without gangrene: Secondary | ICD-10-CM

## 2014-06-03 DIAGNOSIS — Z48812 Encounter for surgical aftercare following surgery on the circulatory system: Secondary | ICD-10-CM | POA: Diagnosis not present

## 2014-06-03 DIAGNOSIS — R0989 Other specified symptoms and signs involving the circulatory and respiratory systems: Secondary | ICD-10-CM | POA: Diagnosis not present

## 2014-06-03 DIAGNOSIS — I739 Peripheral vascular disease, unspecified: Secondary | ICD-10-CM

## 2014-06-03 DIAGNOSIS — E1159 Type 2 diabetes mellitus with other circulatory complications: Secondary | ICD-10-CM

## 2014-06-03 DIAGNOSIS — Z9889 Other specified postprocedural states: Secondary | ICD-10-CM

## 2014-06-03 DIAGNOSIS — R0789 Other chest pain: Secondary | ICD-10-CM | POA: Insufficient documentation

## 2014-06-03 NOTE — Progress Notes (Signed)
[facility] [place] HISTORY AND PHYSICAL -PAD  History of Present Illness Leah Hill is a 74 y.o. female patient of Dr. 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  She returns today for follow up. She rarely walks with a walker, mostly is in her wheelchair, denies non healing wounds. Does not use her prosthesis. She denies any history of stroke or TIA.  She reports that she recently saw her cardiologist, in [place], [place], about chest pressure and her cardiologist and PCP are adjusting her blood pressure medications per pt.  She has been much more active using her walker, doing housework, exercising her left leg.  Pt Diabetic: Yes, reports that her sugars are better since her insulin was changed Pt smoker: non-smoker  Pt meds include: Statin :Yes ASA: Yes Other anticoagulants/antiplatelets: Plavix    Past Medical History  Diagnosis Date  . Arthritis   . CHF (congestive heart failure)   . Hypertension   . Hyperlipidemia   . Peripheral vascular disease   . Diabetes mellitus   . Colitis, Clostridium difficile   . CAD (coronary artery disease)   . Atrial fibrillation   . DVT (deep venous thrombosis)   . Chronic kidney disease   . Myocardial infarction     Social History History  Substance Use Topics  . Smoking status: Never Smoker   . Smokeless tobacco: Never Used  . Alcohol Use: No    Family History Family History  Problem Relation Age of Onset  . Diabetes Mother     Amputation  . Hypertension Mother   . Heart disease Mother     Before age 74  . Hyperlipidemia Mother   . Heart attack Mother   . Peripheral vascular disease Mother   . Diabetes Father   . Hyperlipidemia Father   . Heart attack Father   . Peripheral vascular disease Father   . Diabetes Sister     Amputation  . Heart disease Sister   . Heart attack Sister   . Hyperlipidemia Sister   .  Hypertension Sister   . Diabetes Brother     BKA  . Heart attack Brother   . Cancer Maternal Grandmother   . Diabetes Maternal Grandmother     Past Surgical History  Procedure Laterality Date  . Cholecystectomy      Gall bladder  . Abdominal hysterectomy    . Femoral bypass  03/08/2010    Left Fem-Pop  . Above knee leg amputation  08/13/2009    [place]  AKA  . Coronary artery bypass graft  2009    Allergies  Allergen Reactions  . Penicillins Hives  . Neurontin [Gabapentin] Other (See Comments)    Trimmers    Current Outpatient Prescriptions  Medication Sig Dispense Refill  . atorvastatin (LIPITOR) 80 MG tablet Take 80 mg by mouth daily.    . clopidogrel (PLAVIX) 75 MG tablet Take 75 mg by mouth daily.    Marland Kitchen docusate sodium (COLACE) 100 MG capsule Take 100 mg by mouth daily.    . DULoxetine (CYMBALTA) 60 MG capsule Take 60 mg by mouth daily.    Marland Kitchen esomeprazole (NEXIUM) 40 MG capsule Take 40 mg by mouth daily at 12 noon.    . furosemide (LASIX) 20 MG tablet Take 20 mg by mouth daily.    . Insulin Glargine (LANTUS [place]) Inject 25 Units into the skin at bedtime.    Marland Kitchen  Insulin Lispro, Human, (HUMALOG [place]) Inject 4 Units into the skin 3 (three) times daily.    Marland Kitchen losartan-hydrochlorothiazide (HYZAAR) 100-25 MG per tablet Take by mouth 2 (two) times daily.    . metoprolol tartrate (LOPRESSOR) 25 MG tablet Take 25 mg by mouth 2 (two) times daily.    . nitroGLYCERIN (NITROSTAT) 0.4 MG SL tablet Place 0.4 mg under the tongue every 5 (five) minutes as needed for chest pain.    Marland Kitchen NOVOFINE 30G X 8 MM MISC daily.    Marland Kitchen omeprazole (PRILOSEC) 20 MG capsule Take 20 mg by mouth daily.    . pregabalin (LYRICA) 50 MG capsule Take 50 mg by mouth 2 (two) times daily.    . ranitidine (ZANTAC) 150 MG capsule Take 150 mg by mouth 2 (two) times daily.    . silver sulfADIAZINE (SILVADENE) 1 % cream Apply topically as needed.    Marland Kitchen aspirin 81 MG tablet Take 81 mg by mouth daily.     No current  facility-administered medications for this visit.    ROS: See HPI for pertinent positives and negatives.   Physical Examination  Filed Vitals:   06/03/14 1616  BP: 170/82  Pulse: 79  Resp: 16  Height:  (1.6 m)  Weight: 193 lb (87.544 kg)  SpO2: 91%   Body mass index is 34.2 kg/(m^2).   General: A&O x 3, WDWN, obese female. Gait: in wheelchair Eyes: Pupils equal Pulmonary: CTAB, without wheezes , rales or rhonchi. Cardiac: regular Rythm , without detected murmur.     Carotid Bruits Right Left   Positive, moderate Positive, mild  Aorta is not palpable. Radial pulses: 1+ palpable and =   VASCULAR EXAM: Extremities without ischemic changes  without Gangrene; without open wounds. Right AKA, well healed stump, no ulcerations     LE Pulses Right Left   FEMORAL not palpable sitting in w/c not palpable sitting in w/c    POPLITEAL AKA not palpable   POSTERIOR TIBIAL AKA  not palpable    DORSALIS PEDIS  ANTERIOR TIBIAL AKA not palpable    Abdomen: soft, NT, no palpable masses, obese abdomen, large panus. Skin: no rashes, see extremities. Musculoskeletal: no muscle wasting or atrophy. Right AKA. Neurologic: A&O X 3; Appropriate Affect, MOTOR FUNCTION: moving all extremities equally, motor strength 4/5 throughout. Speech is fluent/normal. CN 2-12 intact.        Non-Invasive Vascular Imaging: DATE: 06/03/2014 LOWER EXTREMITY ARTERIAL DUPLEX EVALUATION    INDICATION: Follow-up left lower extremity peripheral vascular disease     PREVIOUS INTERVENTION(S): Left femoropopliteal arterial bypass graft placed 03/08/2010 Right AKA    DUPLEX EXAM:     RIGHT  LEFT   Peak Systolic Velocity (cm/s) Ratio (if abnormal) Waveform  Peak Systolic Velocity (cm/s)  Ratio (if abnormal) Waveform     Inflow Artery 170  B     Proximal Anastomosis 212  B     Proximal Graft 58  B     Mid Graft 48  B      Distal Graft 53  B     Distal Anastomosis 60  B     Outflow Artery 85  B   Today's ABI / TBI 0.92   Previous ABI / TBI (11/13/2013 ) 0.72    Waveform:    M - Monophasic       B - Biphasic       T - Triphasic  If Ankle Brachial Index (ABI) or Toe Brachial Index (TBI) performed, please see  complete report  ADDITIONAL FINDINGS:     IMPRESSION: Widely patent left femoropopliteal arterial bypass graft without evidence of restenosis or hyperplasia.     Compared to the previous exam:  Improvement compared to previous exam's findings.     ASSESSMENT: Leah Hill is a 74 y.o. 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  She has been more active, using her walker a great deal, doing seated left leg exercises daily. Today's left LE arterial Duplex reveals a widely patent left femoropopliteal arterial bypass graft without evidence of restenosis or hyperplasia; improvement compared to previous Duplex findings. Left ABI significantly improved from 11/13/13; this is most likely due to her significant increase in walking and left leg exercises and improved control of her DM. Pt was congratulated on her successful efforts.  She sees a podiatrist regularly.   Bilateral carotid bruits present, no carotid Duplex results on file, pt denies having any neck US done, denies any history of stroke or TIA. Will add carotid Duplex on her return for left LE arterial surveillance in 1 year.   PLAN:  I discussed in depth with the patient the nature of atherosclerosis, and emphasized the importance of maximal medical management including strict control of blood pressure, blood glucose, and lipid levels, obtaining regular exercise, and continued cessation of smoking.  The patient is aware that without maximal  medical management the underlying atherosclerotic disease process will progress, limiting the benefit of any interventions.  Based on the patient's vascular studies and examination, pt will return to clinic in 1 year with left ABI, left LE arterial Duplex, and carotid Duplex.  The patient was given information about PAD including signs, symptoms, treatment, what symptoms should prompt the patient to seek immediate medical care, and risk reduction measures to take.  Charisse MarchSuzanne Ronel Rodeheaver, RN, MSN, FNP-C [facility] [place] Office Phone: [phone]  Clinic MD: Edilia BoDickson  06/03/2014 4:17 PM

## 2014-06-03 NOTE — Patient Instructions (Signed)

## 2015-06-01 ENCOUNTER — Encounter: Payer: Self-pay | Admitting: Family

## 2015-06-09 ENCOUNTER — Ambulatory Visit: Payer: Medicare Other | Admitting: Family

## 2015-06-09 ENCOUNTER — Encounter (HOSPITAL_COMMUNITY): Payer: Medicare Other

## 2015-08-13 ENCOUNTER — Encounter: Payer: Self-pay | Admitting: Family

## 2015-08-19 ENCOUNTER — Ambulatory Visit (INDEPENDENT_AMBULATORY_CARE_PROVIDER_SITE_OTHER): Payer: Medicare Other | Admitting: Family

## 2015-08-19 ENCOUNTER — Other Ambulatory Visit: Payer: Self-pay | Admitting: Family

## 2015-08-19 ENCOUNTER — Encounter: Payer: Self-pay | Admitting: Family

## 2015-08-19 ENCOUNTER — Ambulatory Visit (INDEPENDENT_AMBULATORY_CARE_PROVIDER_SITE_OTHER)
Admission: RE | Admit: 2015-08-19 | Discharge: 2015-08-19 | Disposition: A | Discharge status: Home / Self Care | Payer: Medicare Other | Source: Ambulatory Visit | Attending: Family | Admitting: Family

## 2015-08-19 ENCOUNTER — Ambulatory Visit (HOSPITAL_COMMUNITY)
Admission: RE | Admit: 2015-08-19 | Discharge: 2015-08-19 | Disposition: A | Discharge status: Home / Self Care | Payer: Medicare Other | Source: Ambulatory Visit | Attending: Family | Admitting: Family

## 2015-08-19 VITALS — BP 156/80 | HR 101 | Ht 63.0 in | Wt 193.0 lb

## 2015-08-19 DIAGNOSIS — Z89611 Acquired absence of right leg above knee: Secondary | ICD-10-CM | POA: Diagnosis not present

## 2015-08-19 DIAGNOSIS — E119 Type 2 diabetes mellitus without complications: Secondary | ICD-10-CM | POA: Diagnosis not present

## 2015-08-19 DIAGNOSIS — E785 Hyperlipidemia, unspecified: Secondary | ICD-10-CM | POA: Diagnosis not present

## 2015-08-19 DIAGNOSIS — I779 Disorder of arteries and arterioles, unspecified: Secondary | ICD-10-CM | POA: Diagnosis not present

## 2015-08-19 DIAGNOSIS — Z95828 Presence of other vascular implants and grafts: Secondary | ICD-10-CM

## 2015-08-19 DIAGNOSIS — I739 Peripheral vascular disease, unspecified: Secondary | ICD-10-CM

## 2015-08-19 DIAGNOSIS — I6523 Occlusion and stenosis of bilateral carotid arteries: Secondary | ICD-10-CM | POA: Insufficient documentation

## 2015-08-19 DIAGNOSIS — R0989 Other specified symptoms and signs involving the circulatory and respiratory systems: Secondary | ICD-10-CM

## 2015-08-19 DIAGNOSIS — I10 Essential (primary) hypertension: Secondary | ICD-10-CM | POA: Insufficient documentation

## 2015-08-19 DIAGNOSIS — Z48812 Encounter for surgical aftercare following surgery on the circulatory system: Secondary | ICD-10-CM

## 2015-08-19 DIAGNOSIS — E1151 Type 2 diabetes mellitus with diabetic peripheral angiopathy without gangrene: Secondary | ICD-10-CM | POA: Diagnosis not present

## 2015-08-19 DIAGNOSIS — Z4889 Encounter for other specified surgical aftercare: Secondary | ICD-10-CM

## 2015-08-19 NOTE — Patient Instructions (Addendum)
Peripheral Vascular Disease Peripheral vascular disease (PVD) is a disease of the blood vessels that are not part of your heart and brain. A simple term for PVD is poor circulation. In most cases, PVD narrows the blood vessels that carry blood from your heart to the rest of your body. This can result in a decreased supply of blood to your arms, legs, and internal organs, like your stomach or kidneys. However, it most often affects a person's lower legs and feet. There are two types of PVD.  Organic PVD. This is the more common type. It is caused by damage to the structure of blood vessels.  Functional PVD. This is caused by conditions that make blood vessels contract and tighten (spasm). Without treatment, PVD tends to get worse over time. PVD can also lead to acute ischemic limb. This is when an arm or limb suddenly has trouble getting enough blood. This is a medical emergency. CAUSES Each type of PVD has many different causes. The most common cause of PVD is buildup of a fatty material (plaque) inside of your arteries (atherosclerosis). Small amounts of plaque can break off from the walls of the blood vessels and become lodged in a smaller artery. This blocks blood flow and can cause acute ischemic limb. Other common causes of PVD include:  Blood clots that form inside of blood vessels.  Injuries to blood vessels.  Diseases that cause inflammation of blood vessels or cause blood vessel spasms.  Health behaviors and health history that increase your risk of developing PVD. RISK FACTORS  You may have a greater risk of PVD if you:  Have a family history of PVD.  Have certain medical conditions, including:  High cholesterol.  Diabetes.  High blood pressure (hypertension).  Coronary heart disease.  Past problems with blood clots.  Past injury, such as burns or a broken bone. These may have damaged blood vessels in your limbs.  Buerger disease. This is caused by inflamed blood  vessels in your hands and feet.  Some forms of arthritis.  Rare birth defects that affect the arteries in your legs.  Use tobacco.  Do not get enough exercise.  Are obese.  Are age 50 or older. SIGNS AND SYMPTOMS  PVD may cause many different symptoms. Your symptoms depend on what part of your body is not getting enough blood. Some common signs and symptoms include:  Cramps in your lower legs. This may be a symptom of poor leg circulation (claudication).  Pain and weakness in your legs while you are physically active that goes away when you rest (intermittent claudication).  Leg pain when at rest.  Leg numbness, tingling, or weakness.  Coldness in a leg or foot, especially when compared with the other leg.  Skin or hair changes. These can include:  Hair loss.  Shiny skin.  Pale or bluish skin.  Thick toenails.  Inability to get or maintain an erection (erectile dysfunction). People with PVD are more prone to developing ulcers and sores on their toes, feet, or legs. These may take longer than normal to heal. DIAGNOSIS Your health care provider may diagnose PVD from your signs and symptoms. The health care provider will also do a physical exam. You may have tests to find out what is causing your PVD and determine its severity. Tests may include:  Blood pressure recordings from your arms and legs and measurements of the strength of your pulses (pulse volume recordings).  Imaging studies using sound waves to take pictures of   the blood flow through your blood vessels (Doppler ultrasound).  Injecting a dye into your blood vessels before having imaging studies using:  X-rays (angiogram or arteriogram).  Computer-generated X-rays (CT angiogram).  A powerful electromagnetic field and a computer (magnetic resonance angiogram or MRA). TREATMENT Treatment for PVD depends on the cause of your condition and the severity of your symptoms. It also depends on your age. Underlying  causes need to be treated and controlled. These include long-lasting (chronic) conditions, such as diabetes, high cholesterol, and high blood pressure. You may need to first try making lifestyle changes and taking medicines. Surgery may be needed if these do not work. Lifestyle changes may include:  Quitting smoking.  Exercising regularly.  Following a low-fat, low-cholesterol diet. Medicines may include:  Blood thinners to prevent blood clots.  Medicines to improve blood flow.  Medicines to improve your blood cholesterol levels. Surgical procedures may include:  A procedure that uses an inflated balloon to open a blocked artery and improve blood flow (angioplasty).  A procedure to put in a tube (stent) to keep a blocked artery open (stent implant).  Surgery to reroute blood flow around a blocked artery (peripheral bypass surgery).  Surgery to remove dead tissue from an infected wound on the affected limb.  Amputation. This is surgical removal of the affected limb. This may be necessary in cases of acute ischemic limb that are not improved through medical or surgical treatments. HOME CARE INSTRUCTIONS  Take medicines only as directed by your health care provider.  Do not use any tobacco products, including cigarettes, chewing tobacco, or electronic cigarettes. If you need help quitting, ask your health care provider.  Lose weight if you are overweight, and maintain a healthy weight as directed by your health care provider.  Eat a diet that is low in fat and cholesterol. If you need help, ask your health care provider.  Exercise regularly. Ask your health care provider to suggest some good activities for you.  Use compression stockings or other mechanical devices as directed by your health care provider.  Take good care of your feet.  Wear comfortable shoes that fit well.  Check your feet often for any cuts or sores. SEEK MEDICAL CARE IF:  You have cramps in your legs  while walking.  You have leg pain when you are at rest.  You have coldness in a leg or foot.  Your skin changes.  You have erectile dysfunction.  You have cuts or sores on your feet that are not healing. SEEK IMMEDIATE MEDICAL CARE IF:  Your arm or leg turns cold and blue.  Your arms or legs become red, warm, swollen, painful, or numb.  You have chest pain or trouble breathing.  You suddenly have weakness in your face, arm, or leg.  You become very confused or lose the ability to speak.  You suddenly have a very bad headache or lose your vision.   This information is not intended to replace advice given to you by your health care provider. Make sure you discuss any questions you have with your health care provider.   Document Released: 04/27/2004 Document Revised: 04/10/2014 Document Reviewed: 08/28/2013 [employer] Interactive Patient Education 2016 [employer]  

## 2015-08-19 NOTE — Progress Notes (Signed)
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 female patient of Dr. 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  She returns today for follow up. She rarely walks with a walker, mostly is in her wheelchair, denies non healing wounds. Does not use her prosthesis. She has right leg phantom pruritus.  She denies any history of stroke or TIA.  She was hospitalized in February 2017, in [place], [place], for exacerbation of CHF.   Pt Diabetic: Yes, reports that her sugars are better since her insulin was changed, states she was not told what her last A1C was. States her FBS is low, in the 50's-60's.  Pt smoker: non-smoker  Pt meds include: Statin :Yes ASA: Yes Other anticoagulants/antiplatelets: Plavix    Current Outpatient Prescriptions  Medication Sig Dispense Refill  . aspirin 81 MG tablet Take 81 mg by mouth daily.    Marland Kitchen atorvastatin (LIPITOR) 80 MG tablet Take 80 mg by mouth daily.    . clopidogrel (PLAVIX) 75 MG tablet Take 75 mg by mouth daily.    Marland Kitchen docusate sodium (COLACE) 100 MG capsule Take 100 mg by mouth daily.    . DULoxetine (CYMBALTA) 60 MG capsule Take 60 mg by mouth daily.    Marland Kitchen esomeprazole (NEXIUM) 40 MG capsule Take 40 mg by mouth daily at 12 noon.    . furosemide (LASIX) 20 MG tablet Take 20 mg by mouth daily.    . Insulin Glargine (LANTUS [place]) Inject 25 Units into the skin at bedtime.    . Insulin Lispro, Human, (HUMALOG [place]) Inject 4 Units into the skin 3 (three) times daily.    Marland Kitchen losartan-hydrochlorothiazide (HYZAAR) 100-25 MG per tablet Take by mouth 2 (two) times daily.    . metoprolol tartrate (LOPRESSOR) 25 MG tablet Take 25 mg by mouth 2 (two) times daily.    . nitroGLYCERIN (NITROSTAT) 0.4 MG SL tablet Place 0.4 mg under the tongue every 5 (five)  minutes as needed for chest pain.    Marland Kitchen NOVOFINE 30G X 8 MM MISC daily.    Marland Kitchen omeprazole (PRILOSEC) 20 MG capsule Take 20 mg by mouth daily.    . pregabalin (LYRICA) 50 MG capsule Take 50 mg by mouth 2 (two) times daily.    . ranitidine (ZANTAC) 150 MG capsule Take 150 mg by mouth 2 (two) times daily.    . silver sulfADIAZINE (SILVADENE) 1 % cream Apply topically as needed.     No current facility-administered medications for this visit.    Past Medical History  Diagnosis Date  . Arthritis   . CHF (congestive heart failure) (HCC)   . Hypertension   . Hyperlipidemia   . Peripheral vascular disease (HCC)   . Diabetes mellitus   . Colitis, Clostridium difficile   . CAD (coronary artery disease)   . Atrial fibrillation (HCC)   . DVT (deep venous thrombosis) (HCC)   . Chronic kidney disease   . Myocardial infarction [facility])     Social History Social History  Substance Use Topics  . Smoking status: Never Smoker   . Smokeless tobacco: Never Used  . Alcohol Use: No    Family History Family History  Problem Relation Age of Onset  . Diabetes Mother     Amputation  .  Hypertension Mother   . Heart disease Mother     Before age 74  . Hyperlipidemia Mother   . Heart attack Mother   . Peripheral vascular disease Mother   . Diabetes Father   . Hyperlipidemia Father   . Heart attack Father   . Peripheral vascular disease Father   . Heart disease Father     before age 74  . Diabetes Sister     Amputation  . Heart disease Sister     before age 74  . Heart attack Sister   . Hyperlipidemia Sister   . Hypertension Sister   . Diabetes Brother     BKA  . Heart attack Brother   . Cancer Maternal Grandmother   . Diabetes Maternal Grandmother     Surgical History Past Surgical History  Procedure Laterality Date  . Cholecystectomy      Gall bladder  . Abdominal hysterectomy    . Femoral bypass  03/08/2010    Left Fem-Pop  . Above knee leg amputation  08/13/2009    Righst  AKA   . Coronary artery bypass graft  2009    Allergies  Allergen Reactions  . Penicillins Hives  . Neurontin [Gabapentin] Other (See Comments)    Trimmers    Current Outpatient Prescriptions  Medication Sig Dispense Refill  . aspirin 81 MG tablet Take 81 mg by mouth daily.    Marland Kitchen. atorvastatin (LIPITOR) 80 MG tablet Take 80 mg by mouth daily.    . clopidogrel (PLAVIX) 75 MG tablet Take 75 mg by mouth daily.    Marland Kitchen. docusate sodium (COLACE) 100 MG capsule Take 100 mg by mouth daily.    . DULoxetine (CYMBALTA) 60 MG capsule Take 60 mg by mouth daily.    Marland Kitchen. esomeprazole (NEXIUM) 40 MG capsule Take 40 mg by mouth daily at 12 noon.    . furosemide (LASIX) 20 MG tablet Take 20 mg by mouth daily.    . Insulin Glargine (LANTUS [place]) Inject 25 Units into the skin at bedtime.    . Insulin Lispro, Human, (HUMALOG [place]) Inject 4 Units into the skin 3 (three) times daily.    Marland Kitchen. losartan-hydrochlorothiazide (HYZAAR) 100-25 MG per tablet Take by mouth 2 (two) times daily.    . metoprolol tartrate (LOPRESSOR) 25 MG tablet Take 25 mg by mouth 2 (two) times daily.    . nitroGLYCERIN (NITROSTAT) 0.4 MG SL tablet Place 0.4 mg under the tongue every 5 (five) minutes as needed for chest pain.    Marland Kitchen. NOVOFINE 30G X 8 MM MISC daily.    Marland Kitchen. omeprazole (PRILOSEC) 20 MG capsule Take 20 mg by mouth daily.    . pregabalin (LYRICA) 50 MG capsule Take 50 mg by mouth 2 (two) times daily.    . ranitidine (ZANTAC) 150 MG capsule Take 150 mg by mouth 2 (two) times daily.    . silver sulfADIAZINE (SILVADENE) 1 % cream Apply topically as needed.     No current facility-administered medications for this visit.     REVIEW OF SYSTEMS: See HPI for pertinent positives and negatives.  Physical Examination Filed Vitals:   08/19/15 1140 08/19/15 1152  BP: 154/79 156/80  Pulse: 101   Height: 5\' 3"  (1.6 m)   Weight: 193 lb (87.544 kg)   SpO2: 94%    Body mass index is 34.2 kg/(m^2).  General: A&O x 3, WDWN, obese female. Gait: in  wheelchair Eyes: Pupils equal Pulmonary: CTAB, without wheezes , rales or rhonchi. Cardiac: regular  rythm, no detected murmur.     Carotid Bruits Right Left   Positive, moderate Positive, mild  Aorta is not palpable. Radial pulses: 1+ palpable and =   VASCULAR EXAM: Extremities without ischemic changes  without Gangrene; without open wounds. Right AKA, well healed stump, no ulcerations     LE Pulses Right Left   FEMORAL not palpable sitting in w/c not palpable sitting in w/c    POPLITEAL AKA not palpable   POSTERIOR TIBIAL AKA  not palpable    DORSALIS PEDIS  ANTERIOR TIBIAL AKA not palpable    Abdomen: soft, NT, no palpable masses, obese abdomen, large panus. Skin: no rashes, see extremities. Musculoskeletal: no muscle wasting or atrophy. Right AKA. Neurologic: A&O X 3; Appropriate Affect, MOTOR FUNCTION: moving all extremities equally, motor strength 4/5 throughout. Speech is fluent/normal. CN 2-12 intact.                Non-Invasive Vascular Imaging (08/19/2015):  CEREBROVASCULAR DUPLEX EVALUATION    INDICATION: Carotid artery disease    PREVIOUS INTERVENTION(S): Bilateral bruit    DUPLEX EXAM: Carotid duplex    RIGHT  LEFT  Peak Systolic Velocities (cm/s) End Diastolic Velocities (cm/s) Plaque LOCATION Peak Systolic Velocities (cm/s) End Diastolic Velocities (cm/s) Plaque  81 14  CCA PROXIMAL 114 9   89 11  CCA MID 108 13   86 11 HM CCA DISTAL 83 12 HM  109 6  ECA 107 8 HT  81 18 HT ICA PROXIMAL 58 13 HT  68 22  ICA MID 77 24   71 24  ICA DISTAL 77 27     .91 ICA / CCA Ratio (PSV) .53  Antegrade Vertebral Flow Antegrade   Brachial Systolic Pressure (mmHg)   Biphasic Brachial Artery Waveforms Triphasic    Plaque  Morphology:  HM = Homogeneous, HT = Heterogeneous, CP = Calcific Plaque, SP = Smooth Plaque, IP = Irregular Plaque     ADDITIONAL FINDINGS: Multiphasic subclavian arteries. Access right arm    IMPRESSION: 1. Less than 40% bilateral internal carotid artery stenosis    Compared to the previous exam:  No prior exam      LOWER EXTREMITY ARTERIAL DUPLEX EVALUATION    INDICATION: PVD    PREVIOUS INTERVENTION(S): Left femoropopliteal artery bypass graft placed 03/08/2010; Right AKA    DUPLEX EXAM:     RIGHT  LEFT   Peak Systolic Velocity (cm/s) Ratio (if abnormal) Waveform  Peak Systolic Velocity (cm/s) Ratio (if abnormal) Waveform     Inflow Artery 155  B     Proximal Anastomosis 216  B     Proximal Graft 66  B     Mid Graft 76  B      Distal Graft 70  B     Distal Anastomosis 71  B     Outflow Artery 74  B  BKA Today's ABI / TBI 1.0   Previous ABI / TBI ( 06/03/14 ) 0.92    Waveform:    M - Monophasic       B - Biphasic       T - Triphasic  If Ankle Brachial Index (ABI) or Toe Brachial Index (TBI) performed, please see complete report     ADDITIONAL FINDINGS:     IMPRESSION: 1. Widely patent left femoropopliteal artery bypass graft without evidence of restenosis    Compared to the previous exam:  No change since prior exam        ASSESSMENT:  Leah Hill is a 75 y.o. 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   Today's left LE arterial Duplex reveals a widely patent left femoropopliteal arterial bypass graft without evidence of restenosis or hyperplasia; no change since prior exam. Left ABI waveforms in March 2016 were triphasic, today they are mono and biphasic; niece states pt is rarely walking, no barriers to walking with her walker. She does not walk enough to elicit claudication sx's.   She sees a podiatrist regularly.   Bilateral carotid bruits: less than 40% bilateral ICA stenosis on  carotid duplex today.  She has no hx of stroke or TIA. Will not need to check carotid duplex for a few years.   PLAN:   Graduated walking program as discussed. Daily seated left leg exercises as discussed and demonstrated.   Based on today's exam and non-invasive vascular lab results, the patient will follow up in 6 months with the following tests: left ABI, left LE arterial duplex in a year, carotid duplex in 2 years. I discussed in depth with the patient the nature of atherosclerosis, and emphasized the importance of maximal medical management including strict control of blood pressure, blood glucose, and lipid levels, obtaining regular exercise, and cessation of smoking.  The patient is aware that without maximal medical management the underlying atherosclerotic disease process will progress, limiting the benefit of any interventions.  The patient was given information about stroke prevention and what symptoms should prompt the patient to seek immediate medical care.  The patient was given information about PAD including signs, symptoms, treatment, what symptoms should prompt the patient to seek immediate medical care, and risk reduction measures to take. Thank you for allowing [place] to participate in this patient's care.  Charisse March, RN, MSN, FNP-C [facility] Office: [phone]  Clinic MD: Imogene Burn 08/19/2015 12:00 PM

## 2015-11-02 ENCOUNTER — Other Ambulatory Visit: Payer: Self-pay | Admitting: Family

## 2015-11-02 DIAGNOSIS — I779 Disorder of arteries and arterioles, unspecified: Secondary | ICD-10-CM

## 2015-11-02 DIAGNOSIS — R0989 Other specified symptoms and signs involving the circulatory and respiratory systems: Secondary | ICD-10-CM

## 2016-02-23 ENCOUNTER — Encounter: Payer: Self-pay | Admitting: Family

## 2016-03-02 ENCOUNTER — Ambulatory Visit: Payer: Medicare Other | Admitting: Family

## 2016-03-02 ENCOUNTER — Encounter (HOSPITAL_COMMUNITY): Payer: Medicare Other

## 2016-10-02 ENCOUNTER — Encounter: Payer: Self-pay | Admitting: Family

## 2016-10-09 ENCOUNTER — Encounter: Payer: Self-pay | Admitting: Family

## 2016-10-09 ENCOUNTER — Ambulatory Visit (HOSPITAL_COMMUNITY)
Admission: RE | Admit: 2016-10-09 | Discharge: 2016-10-09 | Disposition: A | Discharge status: Home / Self Care | Payer: Medicare Other | Source: Ambulatory Visit | Attending: Vascular Surgery | Admitting: Vascular Surgery

## 2016-10-09 ENCOUNTER — Ambulatory Visit (INDEPENDENT_AMBULATORY_CARE_PROVIDER_SITE_OTHER): Payer: Medicare Other | Admitting: Family

## 2016-10-09 DIAGNOSIS — I779 Disorder of arteries and arterioles, unspecified: Secondary | ICD-10-CM

## 2016-10-09 DIAGNOSIS — Z95828 Presence of other vascular implants and grafts: Secondary | ICD-10-CM | POA: Diagnosis not present

## 2016-10-09 DIAGNOSIS — R0989 Other specified symptoms and signs involving the circulatory and respiratory systems: Secondary | ICD-10-CM

## 2016-10-09 DIAGNOSIS — Z89611 Acquired absence of right leg above knee: Secondary | ICD-10-CM | POA: Diagnosis not present

## 2016-10-09 NOTE — Progress Notes (Signed)
[facility] [place]   CC: Follow up peripheral artery occlusive disease  History of Present Illness Leah Hill is a 77 y.o. female patient of Dr. 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  She returns today for follow up. She rarely walks with a walker, mostly is in her wheelchair, denies non healing wounds. Does not use her prosthesis. She has left leg pruritus.  She denies any history of stroke or TIA.  She dialyzes T-TH-S via right upper arm AV fistula.   She was hospitalized in February 2017, in [place], [place], for exacerbation of CHF.  Pt Diabetic: Yes, reports that her sugars are better since her insulin was changed, states she was not told what her last A1C was. States her FBS is low, in the 50's-60's.  Pt smoker: non-smoker  Pt meds include: Statin :Yes ASA: Yes Other anticoagulants/antiplatelets: Plavix     Past Medical History:  Diagnosis Date  . Arthritis   . Atrial fibrillation (HCC)   . CAD (coronary artery disease)   . CHF (congestive heart failure) (HCC)   . Chronic kidney disease   . Colitis, Clostridium difficile   . Diabetes mellitus   . DVT (deep venous thrombosis) (HCC)   . Hyperlipidemia   . Hypertension   . Myocardial infarction (HCC)   . Peripheral vascular disease [facility])     Social History Social History  Substance Use Topics  . Smoking status: Never Smoker  . Smokeless tobacco: Never Used  . Alcohol use No    Family History Family History  Problem Relation Age of Onset  . Diabetes Mother        Amputation  . Hypertension Mother   . Heart disease Mother        Before age 35  . Hyperlipidemia Mother   . Heart attack Mother   . Peripheral vascular disease Mother   . Diabetes Father   . Hyperlipidemia Father   . Heart attack Father   . Peripheral vascular disease Father   . Heart disease Father        before age 52  . Diabetes  Sister        Amputation  . Heart disease Sister        before age 55  . Heart attack Sister   . Hyperlipidemia Sister   . Hypertension Sister   . Diabetes Brother        BKA  . Heart attack Brother   . Cancer Maternal Grandmother   . Diabetes Maternal Grandmother     Past Surgical History:  Procedure Laterality Date  . ABDOMINAL HYSTERECTOMY    . ABOVE KNEE LEG AMPUTATION  08/13/2009   Righst  AKA  . CHOLECYSTECTOMY     Gall bladder  . CORONARY ARTERY BYPASS GRAFT  2009  . FEMORAL BYPASS  03/08/2010   Left Fem-Pop    Allergies  Allergen Reactions  . Penicillins Hives  . Neurontin [Gabapentin] Other (See Comments)    Trimmers    Current Outpatient Prescriptions  Medication Sig Dispense Refill  . amLODipine (NORVASC) 10 MG tablet Take 10 mg by mouth daily.    Marland Kitchen apixaban (ELIQUIS) 2.5 MG TABS tablet Take 2.5 mg by mouth 2 (two) times daily.    Marland Kitchen aspirin 81 MG tablet Take 81 mg by mouth daily.    Marland Kitchen atorvastatin (LIPITOR) 80 MG tablet Take 80 mg by mouth daily.    Marland Kitchen  docusate sodium (COLACE) 100 MG capsule Take 100 mg by mouth daily.    . Insulin Glargine (TOUJEO MAX SOLOSTAR) 300 UNIT/ML SOPN Inject 55 Units into the skin at bedtime.    . Insulin Lispro, Human, (HUMALOG [place]) Inject 4 Units into the skin 3 (three) times daily.    Marland Kitchen levothyroxine (SYNTHROID, LEVOTHROID) 25 MCG tablet Take 25 mcg by mouth daily before breakfast.    . loratadine (CLARITIN) 10 MG tablet Take 10 mg by mouth daily.    . magnesium oxide (MAG-OX) 400 MG tablet Take 400 mg by mouth daily.    . metoprolol tartrate (LOPRESSOR) 25 MG tablet Take 25 mg by mouth 2 (two) times daily.    . nitroGLYCERIN (NITROSTAT) 0.4 MG SL tablet Place 0.4 mg under the tongue every 5 (five) minutes as needed for chest pain.    . ranitidine (ZANTAC) 150 MG capsule Take 150 mg by mouth 2 (two) times daily.     No current facility-administered medications for this visit.     ROS: See HPI for pertinent positives and  negatives.   Physical Examination  Vitals:   10/09/16 1429  BP: 125/63  Pulse: 73  Resp: 20  Temp: 98.3 F (36.8 C)  TempSrc: Oral  SpO2: 98%  Weight: 190 lb 11.2 oz (86.5 kg)  Height: 5\' 3"  (1.6 m)   Body mass index is 33.78 kg/m.  General: A&O x 3, WDWN, obese female. Gait: in wheelchair Eyes: Pupils equal Pulmonary: CTAB, without wheezes , rales or rhonchi. Cardiac: regular rythm, no detected murmur.     Carotid Bruits Right Left   Positive, moderate Positive, mild  Aorta is not palpable. Radial pulses: 1+ palpable and =   VASCULAR EXAM: Extremities without ischemic changes  without Gangrene; without open wounds. Right AKA, well healed stump, no ulcerations. Right upper arm AV fistula with palpable thrill.      LE Pulses Right Left   FEMORAL palpable   palpable    POPLITEAL AKA not palpable   POSTERIOR TIBIAL AKA  not palpable    DORSALIS PEDIS  ANTERIOR TIBIAL AKA not palpable    Abdomen: soft, NT, no palpable masses, obese abdomen, large panus. Skin: no rashes, see extremities. Musculoskeletal: no muscle wasting or atrophy. Right AKA. Neurologic: A&O X 3; Appropriate Affect, MOTOR FUNCTION: moving all extremities equally, motor strength 4/5 throughout. Speech is fluent/normal. CN 2-12 intact     ASSESSMENT: Leah Hill is a 76 y.o. 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   She does not walk enough to elicit claudication sx's in her left leg.   She sees a podiatrist regularly.   Bilateral carotid bruits: less than 40% bilateral ICA stenosis on carotid duplex (08-19-15).  She has no hx of stroke or TIA.  Her atherosclerotic risk  factors include ESRD, hx of DM, and CAD.  Fortunately she has never used tobacco.    DATA  ABI (Date: 10/09/2016)   R: AKA   L:   ABI: 0.85 (was 1.02 on 08-19-15, but did not correspond with waveforms),   PT: mono (was mono on 08-19-15)  DP: bi (was bi on 08-19-15)  TBI: 1.16 (was 1.37)   Left ABI waveforms in March 2016 were triphasic.  Left LE arterial Duplex (08-19-15): Widely patent left femoropopliteal arterial bypass graft without evidence of restenosis or hyperplasia; no change since prior exam.    PLAN:   She  rarely uses her right AKA prosthesis.  Daily seated left leg exercises as discussed and demonstrated.   Based on today's exam and non-invasive vascular lab results, the patient will follow up in 1 year with the following tests: left ABI, left LE arterial duplex, carotid duplex.   I discussed in depth with the patient the nature of atherosclerosis, and emphasized the importance of maximal medical management including strict control of blood pressure, blood glucose, and lipid levels, obtaining regular exercise, and continued cessation of smoking.  The patient is aware that without maximal medical management the underlying atherosclerotic disease process will progress, limiting the benefit of any interventions.  The patient was given information about PAD including signs, symptoms, treatment, what symptoms should prompt the patient to seek immediate medical care, and risk reduction measures to take.  Charisse MarchSuzanne Cylis Ayars, RN, MSN, FNP-C [facility] [place] Office Phone: [phone]  Clinic MD: Early on call  10/09/16 3:00 PM

## 2016-10-09 NOTE — Patient Instructions (Signed)

## 2016-11-22 NOTE — Addendum Note (Signed)
Addended by: Burton Apley A on: 11/22/2016 11:46 AM   Modules accepted: Orders

## 2017-03-01 ENCOUNTER — Ambulatory Visit: Payer: Medicare Other | Admitting: Family

## 2017-10-10 ENCOUNTER — Encounter (HOSPITAL_COMMUNITY): Payer: Medicare Other

## 2017-10-10 ENCOUNTER — Other Ambulatory Visit (HOSPITAL_COMMUNITY): Payer: Medicare Other

## 2017-10-10 ENCOUNTER — Ambulatory Visit: Payer: Medicare Other | Admitting: Family

## 2017-10-12 ENCOUNTER — Ambulatory Visit (HOSPITAL_COMMUNITY)
Admission: RE | Admit: 2017-10-12 | Discharge: 2017-10-12 | Disposition: A | Discharge status: Home / Self Care | Payer: 59 | Source: Ambulatory Visit | Attending: Family | Admitting: Family

## 2017-10-12 ENCOUNTER — Ambulatory Visit (INDEPENDENT_AMBULATORY_CARE_PROVIDER_SITE_OTHER)
Admission: RE | Admit: 2017-10-12 | Discharge: 2017-10-12 | Disposition: A | Discharge status: Home / Self Care | Payer: 59 | Source: Ambulatory Visit | Attending: Family | Admitting: Family

## 2017-10-12 ENCOUNTER — Encounter: Payer: Self-pay | Admitting: Family

## 2017-10-12 ENCOUNTER — Other Ambulatory Visit: Payer: Self-pay

## 2017-10-12 ENCOUNTER — Ambulatory Visit (INDEPENDENT_AMBULATORY_CARE_PROVIDER_SITE_OTHER): Payer: 59 | Admitting: Family

## 2017-10-12 VITALS — BP 119/68 | HR 79 | Temp 98.0°F | Resp 16 | Ht 63.0 in

## 2017-10-12 DIAGNOSIS — Z95828 Presence of other vascular implants and grafts: Secondary | ICD-10-CM | POA: Insufficient documentation

## 2017-10-12 DIAGNOSIS — Z992 Dependence on renal dialysis: Secondary | ICD-10-CM

## 2017-10-12 DIAGNOSIS — I779 Disorder of arteries and arterioles, unspecified: Secondary | ICD-10-CM

## 2017-10-12 DIAGNOSIS — Z89611 Acquired absence of right leg above knee: Secondary | ICD-10-CM | POA: Insufficient documentation

## 2017-10-12 DIAGNOSIS — I6523 Occlusion and stenosis of bilateral carotid arteries: Secondary | ICD-10-CM | POA: Diagnosis not present

## 2017-10-12 DIAGNOSIS — R0989 Other specified symptoms and signs involving the circulatory and respiratory systems: Secondary | ICD-10-CM | POA: Diagnosis present

## 2017-10-12 DIAGNOSIS — N186 End stage renal disease: Secondary | ICD-10-CM

## 2017-10-12 NOTE — Patient Instructions (Signed)

## 2017-10-12 NOTE — Progress Notes (Signed)
[facility] [place]   CC: Follow up peripheral artery occlusive disease  History of Present Illness Leah Hill Whatley is a 77 y.o. 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 Darrick PennaFields.  She returns today for follow up. She rarely walks with a walker, mostly is in her wheelchair, denies non healing wounds. Does not use her prosthesis. She denies any history of stroke or TIA.  She dialyzes T-TH-S via right upper arm AV fistula. Pt states she will have a procedure in 3 days to open up a narrowing in he AVF. Dr. Anders GrantSekyema in [place] to address this per pt and her daughter.   Her podiatrist is Dr. Nolen MuMcKinney.   [place] was [place] in February 2017, in [place], [place], for exacerbation of CHF.  Diabetic: Yes, reports that her sugars are better since her insulin was changed, states she was not told what her last A1C was. States her FBS is low, in the 50's-60's.  Tobacco use: non-smoker  Pt meds include: Statin :Yes ASA: Yes Other anticoagulants/antiplatelets: Plavix      Past Medical History:  Diagnosis Date  . Arthritis   . Atrial fibrillation (HCC)   . CAD (coronary artery disease)   . CHF (congestive heart failure) (HCC)   . Chronic kidney disease   . Colitis, Clostridium difficile   . Diabetes mellitus   . DVT (deep venous thrombosis) (HCC)   . Hyperlipidemia   . Hypertension   . Myocardial infarction (HCC)   . Peripheral vascular disease [facility](HCC)     Social History Social History   Tobacco Use  . Smoking status: Never Smoker  . Smokeless tobacco: Never Used  Substance Use Topics  . Alcohol use: No  . Drug use: No    Family History Family History  Problem Relation Age of Onset  . Diabetes Mother        Amputation  . Hypertension Mother   . Heart disease Mother        Before age 76  . Hyperlipidemia Mother   . Heart attack Mother   . Peripheral vascular disease Mother    . Diabetes Father   . Hyperlipidemia Father   . Heart attack Father   . Peripheral vascular disease Father   . Heart disease Father        before age 76  . Diabetes Sister        Amputation  . Heart disease Sister        before age 76  . Heart attack Sister   . Hyperlipidemia Sister   . Hypertension Sister   . Diabetes Brother        BKA  . Heart attack Brother   . Cancer Maternal Grandmother   . Diabetes Maternal Grandmother     Past Surgical History:  Procedure Laterality Date  . ABDOMINAL HYSTERECTOMY    . ABOVE KNEE LEG AMPUTATION  08/13/2009   Righst  AKA  . CHOLECYSTECTOMY     Gall bladder  . CORONARY ARTERY BYPASS GRAFT  2009  . FEMORAL BYPASS  03/08/2010   Left Fem-Pop    Allergies  Allergen Reactions  . Penicillins Hives  . Neurontin [Gabapentin] Other (See Comments)    Trimmers    Current Outpatient Medications  Medication Sig Dispense Refill  . amLODipine (NORVASC) 10 MG tablet Take 10 mg by mouth daily.    Marland Kitchen. apixaban (ELIQUIS) 2.5 MG TABS  tablet Take 2.5 mg by mouth 2 (two) times daily.    Marland Kitchen aspirin 81 MG tablet Take 81 mg by mouth daily.    Marland Kitchen atorvastatin (LIPITOR) 80 MG tablet Take 80 mg by mouth daily.    Marland Kitchen docusate sodium (COLACE) 100 MG capsule Take 100 mg by mouth daily.    . Insulin Glargine (TOUJEO MAX SOLOSTAR) 300 UNIT/ML SOPN Inject 55 Units into the skin at bedtime.    . Insulin Lispro, Human, (HUMALOG [place]) Inject 4 Units into the skin 3 (three) times daily.    Marland Kitchen levothyroxine (SYNTHROID, LEVOTHROID) 25 MCG tablet Take 25 mcg by mouth daily before breakfast.    . loratadine (CLARITIN) 10 MG tablet Take 10 mg by mouth daily.    . magnesium oxide (MAG-OX) 400 MG tablet Take 400 mg by mouth daily.    . metoprolol tartrate (LOPRESSOR) 25 MG tablet Take 25 mg by mouth 2 (two) times daily.    . nitroGLYCERIN (NITROSTAT) 0.4 MG SL tablet Place 0.4 mg under the tongue every 5 (five) minutes as needed for chest pain.    . ranitidine (ZANTAC) 150 MG  capsule Take 150 mg by mouth 2 (two) times daily.     No current facility-administered medications for this visit.     ROS: See HPI for pertinent positives and negatives.   Physical Examination  Vitals:   10/12/17 1309  BP: 119/68  Pulse: 79  Resp: 16  Temp: 98 F (36.7 C)  TempSrc: Oral  SpO2: 97%  Height: 5\' 3"  (1.6 m)   Body mass index is 33.78 kg/m.  General: A&O x 3, WDWN, obese female. Gait: seated in wheelchair HENT: No gross abnormalities  Eyes: Pupils equal Pulmonary: Respirations are non labored, CTAB, without wheezes, rales, or rhonchi. Cardiac: regular rythm, no detected murmur.     Carotid Bruits Right Left   Positive, moderate Positive, mild   Abdominal aortic pulse is not palpable. Radial pulses: 1+ palpable and =   VASCULAR EXAM: Extremitieswithout ischemic changes, without Gangrene; without open wounds. Right AKA, well healed stump, no ulcerations. Right upper arm AV fistula with palpable thrill. thin callus at lateral aspect left heel. Great toenail is thick.      LE Pulses Right Left   FEMORAL not palpable seated in w/c  not palpable    POPLITEAL AKA not palpable   POSTERIOR TIBIAL AKA  not palpable    DORSALIS PEDIS  ANTERIOR TIBIAL AKA not palpable    Abdomen: soft, NT, no palpable masses, obese abdomen. Skin: no rashes, no cellulitis, no ulcers. See extremities. Musculoskeletal: no muscle wasting or atrophy. Right AKA. Neurologic: A&O X 3; appropriate affect, Sensation is normal; MOTOR FUNCTION:  moving all extremities equally, motor strength 4/5 throughout. Speech is fluent/normal. CN 2-12 intact. Psychiatric: Thought content is normal, mood appropriate for clinical situation.      ASSESSMENT: Leah Hill is a 77 y.o. 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   She does not walk enough to elicit claudication sx's in her left leg.   She sees a podiatrist regularly.   Bilateral carotid bruits: less than 40% bilateral ICA stenosis on carotid duplex (08-19-15).  She has no hx of stroke or TIA.  Her atherosclerotic risk factors include ESRD, hx of DM, and CAD.  Fortunately she has never used tobacco.    DATA  Carotid Duplex (10-12-17): 1-39% bilateral ICA stenosis Bilateral vertebral  artery flow is antegrade.  AVF on the right arm, normal left subclavian artery waveforms No change compared to the exam on 08-19-15.   Left LE Arterial Duplex (10-12-17); Left fem-pop bypass graft with slight narrowing at the proximal anastomosis with no soft plaque (192 cm/s). All biphasic waveforms except monophasic at mid graft and distal anastomosis.    ABI (Date: 10/12/2017):  R: AKA   Hill:   ABI: 0.85 (was 0.85 on 10-09-16),   PT: mono (was mono)  DP: mono (was bi)  TBI: 0.40, 44 toe pressure  (was 1.16, 128 toe pressure) Right AKA. Stable left ABI with mild disease, monophasic waveforms.  Significant decline in left TBI.    Left ABI waveforms in March 2016 were triphasic.   Left LE arterial Duplex (08-19-15): Widely patent left femoropopliteal arterial bypass graft without evidence of restenosis or hyperplasia; no change since prior exam.    PLAN:   She rarely uses her right AKA prosthesis.  Daily seated left leg exercises as discussed and demonstrated.   Based on today's exam and non-invasive vascular lab results, the patient will follow up in 6 months with the following tests: left ABI, left LE arterial duplex. Carotid duplex in 2 years.  I advised her to notify us if she develops concerns re the circulation in her feet or legs.   I discussed in depth with the  patient the nature of atherosclerosis, and emphasized the importance of maximal medical management including strict control of blood pressure, blood glucose, and lipid levels, obtaining regular exercise, and continued cessation of smoking.  The patient is aware that without maximal medical management the underlying atherosclerotic disease process will progress, limiting the benefit of any interventions.  The patient was given information about PAD including signs, symptoms, treatment, what symptoms should prompt the patient to seek immediate medical care, and risk reduction measures to take.  Charisse March, RN, MSN, FNP-C [facility] [place] Phone: [phone]  Clinic MD: Randie Heinz on call  10/12/17 1:14 PM

## 2017-10-14 ENCOUNTER — Encounter: Payer: Self-pay | Admitting: Family

## 2018-04-01 ENCOUNTER — Other Ambulatory Visit: Payer: Self-pay

## 2018-04-01 DIAGNOSIS — Z95828 Presence of other vascular implants and grafts: Secondary | ICD-10-CM

## 2018-04-01 DIAGNOSIS — I779 Disorder of arteries and arterioles, unspecified: Secondary | ICD-10-CM

## 2018-04-01 DIAGNOSIS — Z89611 Acquired absence of right leg above knee: Secondary | ICD-10-CM

## 2018-04-15 ENCOUNTER — Encounter (HOSPITAL_COMMUNITY): Payer: Medicare Other

## 2018-04-15 ENCOUNTER — Encounter: Payer: Self-pay | Admitting: Family

## 2018-04-15 ENCOUNTER — Other Ambulatory Visit (HOSPITAL_COMMUNITY): Payer: Medicare Other

## 2018-04-15 ENCOUNTER — Ambulatory Visit: Payer: Medicare Other | Admitting: Family

## 2018-05-31 ENCOUNTER — Ambulatory Visit (INDEPENDENT_AMBULATORY_CARE_PROVIDER_SITE_OTHER): Payer: 59 | Admitting: Physician Assistant

## 2018-05-31 ENCOUNTER — Encounter: Payer: Self-pay | Admitting: Family

## 2018-05-31 ENCOUNTER — Ambulatory Visit (INDEPENDENT_AMBULATORY_CARE_PROVIDER_SITE_OTHER)
Admission: RE | Admit: 2018-05-31 | Discharge: 2018-05-31 | Disposition: A | Discharge status: Home / Self Care | Payer: 59 | Source: Ambulatory Visit | Attending: Family | Admitting: Family

## 2018-05-31 ENCOUNTER — Other Ambulatory Visit: Payer: Self-pay

## 2018-05-31 ENCOUNTER — Ambulatory Visit (HOSPITAL_COMMUNITY)
Admission: RE | Admit: 2018-05-31 | Discharge: 2018-05-31 | Disposition: A | Discharge status: Home / Self Care | Payer: 59 | Source: Ambulatory Visit | Attending: Family | Admitting: Family

## 2018-05-31 VITALS — BP 108/59 | HR 81 | Temp 99.2°F | Resp 20 | Ht 63.0 in | Wt 190.0 lb

## 2018-05-31 DIAGNOSIS — I779 Disorder of arteries and arterioles, unspecified: Secondary | ICD-10-CM

## 2018-05-31 DIAGNOSIS — Z89611 Acquired absence of right leg above knee: Secondary | ICD-10-CM | POA: Diagnosis not present

## 2018-05-31 DIAGNOSIS — Z95828 Presence of other vascular implants and grafts: Secondary | ICD-10-CM

## 2018-05-31 NOTE — Progress Notes (Signed)
History of Present Illness:  Patient is a 78 y.o. year old female who presents for evaluation of PAD.  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 Darrick Penna.  She returns today for follow up.  She is WC bound for mobility and needs assistants with tranfers.  Pt meds include: Statin :Yes ASA: Yes Other anticoagulants/antiplatelets: Eliquis      Past Medical History:  Diagnosis Date  . Arthritis   . Atrial fibrillation (HCC)   . CAD (coronary artery disease)   . CHF (congestive heart failure) (HCC)   . Chronic kidney disease   . Colitis, Clostridium difficile   . Diabetes mellitus   . DVT (deep venous thrombosis) (HCC)   . Hyperlipidemia   . Hypertension   . Myocardial infarction (HCC)   . Peripheral vascular disease [facility])     Past Surgical History:  Procedure Laterality Date  . ABDOMINAL HYSTERECTOMY    . ABOVE KNEE LEG AMPUTATION  08/13/2009   Righst  AKA  . CHOLECYSTECTOMY     Gall bladder  . CORONARY ARTERY BYPASS GRAFT  2009  . FEMORAL BYPASS  03/08/2010   Left Fem-Pop    ROS:   General:  No weight loss, Fever, chills  HEENT: No recent headaches, no nasal bleeding, no visual changes, no sore throat  Neurologic: No dizziness, blackouts, seizures. No recent symptoms of stroke or mini- stroke. No recent episodes of slurred speech, or temporary blindness.  Cardiac: No recent episodes of chest pain/pressure, no shortness of breath at rest.  No shortness of breath with exertion.  Denies history of atrial fibrillation or irregular heartbeat  Vascular: No history of rest pain in feet.  No history of claudication.  No history of non-healing ulcer, No history of DVT   Pulmonary: No home oxygen, no productive cough, no hemoptysis,  No asthma or wheezing  Musculoskeletal:  [ ]  Arthritis, [ ]  Low back pain,  [ ]  Joint pain  Hematologic:No history of hypercoagulable state.  No history of easy bleeding.   + history of anemia  Gastrointestinal: No hematochezia or melena,  No gastroesophageal reflux, no trouble swallowing  Urinary: [ ]  chronic Kidney disease, [x ] on HD - [ ]  MWF or [x ] TTHS, [ ]  Burning with urination, [ ]  Frequent urination, [ ]  Difficulty urinating;   Skin: No rashes  Psychological: No history of anxiety,  No history of depression  Social History Social History   Tobacco Use  . Smoking status: Never Smoker  . Smokeless tobacco: Never Used  Substance Use Topics  . Alcohol use: No  . Drug use: No    Family History Family History  Problem Relation Age of Onset  . Diabetes Mother        Amputation  . Hypertension Mother   . Heart disease Mother        Before age 47  . Hyperlipidemia Mother   . Heart attack Mother   . Peripheral vascular disease Mother   . Diabetes Father   . Hyperlipidemia Father   . Heart attack Father   . Peripheral vascular disease Father   . Heart disease Father        before age 55  . Diabetes Sister        Amputation  . Heart disease Sister        before age 80  . Heart attack Sister   .  Hyperlipidemia Sister   . Hypertension Sister   . Diabetes Brother        BKA  . Heart attack Brother   . Cancer Maternal Grandmother   . Diabetes Maternal Grandmother     Allergies  Allergies  Allergen Reactions  . Penicillins Hives  . Neurontin [Gabapentin] Other (See Comments)    Trimmers  . Percocet [Oxycodone-Acetaminophen] Other (See Comments)    hallucinations     Current Outpatient Medications  Medication Sig Dispense Refill  . amLODipine (NORVASC) 10 MG tablet Take 10 mg by mouth daily.    Marland Kitchen apixaban (ELIQUIS) 2.5 MG TABS tablet Take 2.5 mg by mouth 2 (two) times daily.    Marland Kitchen aspirin 81 MG tablet Take 81 mg by mouth daily.    Marland Kitchen atorvastatin (LIPITOR) 80 MG tablet Take 80 mg by mouth daily.    Marland Kitchen docusate sodium (COLACE) 100 MG capsule Take 100 mg by mouth daily.    . Insulin Glargine (TOUJEO MAX SOLOSTAR) 300 UNIT/ML  SOPN Inject 55 Units into the skin at bedtime.    . Insulin Lispro, Human, (HUMALOG [place]) Inject 4 Units into the skin 3 (three) times daily.    Marland Kitchen levothyroxine (SYNTHROID, LEVOTHROID) 25 MCG tablet Take 25 mcg by mouth daily before breakfast.    . loratadine (CLARITIN) 10 MG tablet Take 10 mg by mouth daily.    . magnesium oxide (MAG-OX) 400 MG tablet Take 400 mg by mouth daily.    . metoprolol tartrate (LOPRESSOR) 25 MG tablet Take 25 mg by mouth 2 (two) times daily.    . nitroGLYCERIN (NITROSTAT) 0.4 MG SL tablet Place 0.4 mg under the tongue every 5 (five) minutes as needed for chest pain.     No current facility-administered medications for this visit.     Physical Examination  Vitals:   05/31/18 1524  BP: (!) 108/59  Pulse: 81  Resp: 20  Temp: 99.2 F (37.3 C)  SpO2: 96%  Weight: 190 lb (86.2 kg)  Height:  (1.6 m)    Body mass index is 33.66 kg/m.   Callus that her podiatrist trims periodically.  No drainage, no erythema and non painful to palpation.  General:  Alert and oriented, no acute distress HEENT: Normal Neck: No bruit or JVD Pulmonary: Clear to auscultation bilaterally Cardiac: Regular Rate and Rhythm without murmur Abdomen: Soft, non-tender, non-distended, no mass, no scars Skin: No rash, her left foot has no other signs of ischemia.  Clean dry and warm to the touch. Extremity Pulses:  2+ radial, brachial, non palpable femoral, dorsalis pedis, posterior tibial pulses  Musculoskeletal: No deformity or edema  Neurologic: Upper and left extremity motor grossly intact  DATA:   Left Graft #1: Left femoral artery to popliteal artery +--------------------+--------+--------+----------+--------+                     PSV cm/sStenosisWaveform  Comments +--------------------+--------+--------+----------+--------+ Inflow              58              triphasic          +--------------------+--------+--------+----------+--------+ Proximal  Anastomosis71              triphasic          +--------------------+--------+--------+----------+--------+ Proximal Graft      36              biphasic           +--------------------+--------+--------+----------+--------+ Mid  Graft           32              biphasic           +--------------------+--------+--------+----------+--------+ Distal Graft        97              monophasic         +--------------------+--------+--------+----------+--------+ Distal Anastamosis  50              monophasic         +--------------------+--------+--------+----------+--------+ Outflow             45              biphasic           +--------------------+--------+--------+----------+--------+     Summary: Left: Patent left femoral artery to popliteal artery bypass graft. Unable to duplicate elevated velocities at the proximal anastomosis. +---------+------------------+-----+-------------------+-----------------------+ Left     Lt Pressure (mmHg)IndexWaveform           Comment                 +---------+------------------+-----+-------------------+-----------------------+ Brachial 105                    triphasic                                  +---------+------------------+-----+-------------------+-----------------------+ PTA      50                0.48 dampened monophasic                        +---------+------------------+-----+-------------------+-----------------------+ DP       89                0.85 monophasic                                 +---------+------------------+-----+-------------------+-----------------------+ Great Toe                                          Unable to obtain due to                                                    low amplitude            +---------+------------------+-----+-------------------+-----------------------+  +-------+-----------+----------------+------------+------------+ ABI/TBIToday's ABIToday's TBI     Previous ABIPrevious TBI +-------+-----------+----------------+------------+------------+ Right  AKA        AKA             AKA         AKA          +-------+-----------+----------------+------------+------------+ Left   0.85       unable to obtain0.85        0.40         +-------+-----------+----------------+------------+------------+   ASSESSMENT:  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 Darrick Penna.  Stable ABI and duplex study without change.  PLAN:  She will f/u in 6  months for repeat duplex and ABI's her podiatrist is aware she has PAD and is post bypass.  Her granddaughter is her care giver and is present today.     Mosetta Pigeon PA-C [facility] [place] Office: [phone]  Office MD Randie Heinz

## 2018-11-27 ENCOUNTER — Other Ambulatory Visit: Payer: Self-pay

## 2018-11-27 DIAGNOSIS — I779 Disorder of arteries and arterioles, unspecified: Secondary | ICD-10-CM

## 2018-11-28 ENCOUNTER — Telehealth (HOSPITAL_COMMUNITY): Payer: Self-pay

## 2018-11-28 NOTE — Telephone Encounter (Signed)
Left voicemail with Covid-19 information.          

## 2018-11-29 ENCOUNTER — Ambulatory Visit (HOSPITAL_COMMUNITY): Payer: 59

## 2018-11-29 ENCOUNTER — Ambulatory Visit (HOSPITAL_COMMUNITY)
Admission: RE | Admit: 2018-11-29 | Discharge: 2018-11-29 | Disposition: A | Discharge status: Home / Self Care | Payer: 59 | Source: Ambulatory Visit | Attending: Family | Admitting: Family

## 2018-11-29 ENCOUNTER — Ambulatory Visit: Payer: 59 | Admitting: Family

## 2018-11-29 DIAGNOSIS — I779 Disorder of arteries and arterioles, unspecified: Secondary | ICD-10-CM

## 2019-08-02 DEATH — deceased

## 2019-08-06 ENCOUNTER — Other Ambulatory Visit: Payer: Self-pay | Admitting: *Deleted

## 2019-08-06 DIAGNOSIS — I779 Disorder of arteries and arterioles, unspecified: Secondary | ICD-10-CM

## 2019-08-06 DIAGNOSIS — Z95828 Presence of other vascular implants and grafts: Secondary | ICD-10-CM

## 2019-08-08 ENCOUNTER — Encounter (HOSPITAL_COMMUNITY): Payer: 59

## 2019-08-08 ENCOUNTER — Ambulatory Visit: Payer: 59

## 2019-08-08 ENCOUNTER — Other Ambulatory Visit (HOSPITAL_COMMUNITY): Payer: 59
